# Patient Record
Sex: Female | Born: 1960 | Race: White | Hispanic: No | Marital: Married | Smoking: Never smoker
Health system: Southern US, Community
[De-identification: ages and names within clinical notes are randomized; demographics above are authoritative.]

## PROBLEM LIST (undated history)

## (undated) DIAGNOSIS — J3081 Allergic rhinitis due to animal (cat) (dog) hair and dander: Secondary | ICD-10-CM

## (undated) HISTORY — DX: Allergic rhinitis due to animal (cat) (dog) hair and dander: J30.81

## (undated) HISTORY — PX: TONSILLECTOMY: SUR1361

---

## 2006-04-24 ENCOUNTER — Ambulatory Visit: Payer: Self-pay | Admitting: Internal Medicine

## 2008-01-11 ENCOUNTER — Ambulatory Visit: Payer: Self-pay | Admitting: Occupational Medicine

## 2010-01-09 ENCOUNTER — Ambulatory Visit: Payer: Self-pay | Admitting: Cardiovascular Disease

## 2010-01-09 DIAGNOSIS — R079 Chest pain, unspecified: Secondary | ICD-10-CM | POA: Insufficient documentation

## 2010-01-10 ENCOUNTER — Telehealth: Payer: Self-pay | Admitting: Cardiovascular Disease

## 2010-01-25 ENCOUNTER — Ambulatory Visit: Payer: Self-pay | Admitting: Cardiovascular Disease

## 2010-04-19 ENCOUNTER — Ambulatory Visit: Payer: Self-pay | Admitting: Internal Medicine

## 2010-05-03 NOTE — Assessment & Plan Note (Signed)
Summary: NP6/AMD   Visit Type:  Initial Consult Primary Emina Ribaudo:  Holly Ramirez, M.D.  CC:  c/o chest pressure and palpitations.  Denies shortness of breath.  She had an irreg. stress test 3-4 years ago and just did not really follow up on it.Marland Kitchen  History of Present Illness: Holly Ramirez is a very pleasant 50 year old woman, patient of Dr. Judithann Sheen with a long history of left-sided chest pain with recent recurrence of her symptoms who presents for further evaluation.  She reports that she had chest pain several years ago on the left side. This pressure seemed to resolve without any intervention. She had a stress test in 2008 where she achieved a heart rate of 160 beats a minute. There was some apical hypokinesis. Ejection fraction 55% she did not followup and had no further episodes and had been doing well until several weeks ago when she had 2 episodes in September. They lasted for 30 minutes at a time. Now she has episodes one to 2 times per week and describes it as a dull aching in her left chest. 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  EKG shows normal sinus rhythm with rate 65 beats per minute, no significant ST or T wave changes    Preventive Screening-Counseling & Management  Alcohol-Tobacco     Smoking Status: never  Caffeine-Diet-Exercise     Does Patient Exercise: no      Drug Use:  no.    Current Medications (verified): 1)  None  Allergies (verified): No Known Drug Allergies  Past History:  Family History: Last updated: 2010-01-28 Father: Deceased age 82; prostate cancer Mother:Deceased age 66; MI CABG x 3 Siblings: 1 Brother MI age 80's; living                2 sisters living; good health  Maternal grandparents deceased age MI early 39's.  Social History: Last updated: 01-28-2010 Divorced  Full Time--Office reception school Tobacco Use - No.    Alcohol Use - yes--occas. wine Regular Exercise - no Drug Use - no  Risk Factors: Exercise: no (01-28-2010)  Risk Factors: Smoking Status: never (2010/01/28)  Past Medical History: Allergies  Past Surgical History: tonsillectomy C-section x 2  Family History: Father: Deceased age 10; prostate cancer Mother:Deceased age 26; MI CABG x 3 Siblings: 1 Brother MI age 37's; living                2 sisters living; good health  Maternal grandparents deceased age MI early 61's.  Social History: Divorced  Full Time--Office reception school Tobacco Use - No.  Alcohol Use - yes--occas. wine Regular Exercise - no Drug Use - no Smoking Status:  never Does Patient Exercise:  no Drug Use:  no  Review of Systems       The patient complains of chest pain.  The patient denies fever, weight loss, weight gain, vision loss, decreased hearing, hoarseness, syncope, dyspnea on exertion, peripheral edema, prolonged cough, abdominal pain, incontinence, muscle weakness, depression, and enlarged lymph nodes.    Vital Signs:  Patient profile:   50 year old female Height:      65 inches Weight:      169 pounds BMI:     28.22 Pulse rate:   73 / minute BP sitting:   130 / 87  (left arm) Cuff size:  regular  Vitals Entered By: Bishop Dublin, CMA (January 09, 2010 2:34 PM)  Physical Exam  General:  Well developed, well nourished, in no acute distress. Head:  normocephalic and atraumatic Neck:  Neck supple, no JVD. No masses, thyromegaly or abnormal cervical nodes. Lungs:  Clear bilaterally to auscultation and percussion. Heart:  Non-displaced PMI, chest non-tender; regular rate and rhythm, S1, S2 without murmurs, rubs or gallops. Carotid upstroke normal, no bruit.  Pedals normal pulses. No edema, no varicosities. Abdomen:  Bowel sounds positive; abdomen soft and non-tender without masses Msk:  Back normal, normal gait. Muscle strength and tone normal. Pulses:  pulses normal in all 4  extremities Extremities:  No clubbing or cyanosis. Neurologic:  Alert and oriented x 3. Skin:  Intact without lesions or rashes. Psych:  Normal affect.   Impression & Recommendations:  Problem # 1:  CHEST PAIN UNSPECIFIED (ICD-786.50) Her chest pain symptoms are somewhat atypical. It is very difficult given that she has no risk factors such as diabetes, smoking, known coronary artery disease or hypertension, for her to qualify for a Myoview.  We will set her up for a regular treadmill. She is in agreement with this. If her stress test is negative, the etiology of her discomfort is still uncertain. One could try anti-inflammatories/NSAIDs for possible pericarditis/costochondritis. She should try proton pump inhibitors for a several week period to GERD or gastritis. She has had significant stress in her life. Lastly, she could try nitrates or calcium channel blockers for possible spasm, including coronary or esophageal spasm.  Orders: Treadmill (Treadmill)  Patient Instructions: 1)  Your physician recommends that you continue on your current medications as directed. Please refer to the Current Medication list given to you today. 2)  Your physician has requested that you have an exercise tolerance test.  For further information please visit [email or web address]  Please also follow instruction sheet, as given.

## 2010-05-03 NOTE — Progress Notes (Signed)
Summary: ETT  Phone Note Outgoing Call   Call placed by: Benedict Needy, RN,  January 10, 2010 9:51 AM Call placed to: Patient Summary of Call: Called pt unable to schedule ETT on Friday. Does the pt want to do one day next week while Dr. Mariah Milling isn't here or wait.  Initial call taken by: Benedict Needy, RN,  January 10, 2010 9:52 AM  Follow-up for Phone Call        [place], [place] or [place] the week that  Dr. Mariah Milling is back Benedict Needy, RN  January 11, 2010 5:05 PM   ETT scheduled for 10/27 @ 8am pt to arrive at 7:30am. Unable to leave a message. Benedict Needy, RN  January 12, 2010 9:33 AM   LMOM TCB Benedict Needy, RN  January 15, 2010 3:10 PM   Pt called she is aware of appt.  Follow-up by: Benedict Needy, RN,  January 15, 2010 3:18 PM

## 2010-07-05 ENCOUNTER — Ambulatory Visit: Payer: Self-pay | Admitting: Podiatry

## 2010-10-23 ENCOUNTER — Encounter: Payer: Self-pay | Admitting: Cardiovascular Disease

## 2013-10-30 ENCOUNTER — Ambulatory Visit (INDEPENDENT_AMBULATORY_CARE_PROVIDER_SITE_OTHER): Payer: Self-pay | Admitting: Family Medicine

## 2013-10-30 VITALS — BP 120/80 | HR 69 | Temp 98.0°F | Resp 16 | Ht 66.0 in | Wt 172.0 lb

## 2013-10-30 DIAGNOSIS — Z23 Encounter for immunization: Secondary | ICD-10-CM

## 2013-10-30 DIAGNOSIS — IMO0001 Reserved for inherently not codable concepts without codable children: Secondary | ICD-10-CM

## 2013-10-30 DIAGNOSIS — W5501XA Bitten by cat, initial encounter: Secondary | ICD-10-CM

## 2013-10-30 DIAGNOSIS — T148XXA Other injury of unspecified body region, initial encounter: Secondary | ICD-10-CM

## 2013-10-30 MED ORDER — AMOXICILLIN-POT CLAVULANATE 875-125 MG PO TABS: 1.0000 | ORAL_TABLET | Freq: Two times a day (BID) | ORAL | Status: DC

## 2013-10-30 NOTE — Patient Instructions (Signed)

## 2013-10-30 NOTE — Progress Notes (Signed)
Chief Complaint:  Chief Complaint  Patient presents with  . Animal Bite    Cat bite at work 2 days ago  . Immunizations    Tetnus per her boss    HPI: Holly Ramirez is a 53 y.o. female who is here for  3 day history Left hand pain, swelling after a cat bite. 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 They were trying to draw the blood from its neck vein. The cat which is fully vaccinated got upset and bit the patient. She was not the one who actually was holding her neck  down, she was just helping. The person who actually was holding the cat down did not get bitten apparently. She had some extra left over abx and it was only amoxacillin 50 mg  and took it. She put herself on amoxacillin 7 doses of  500 mg . She cleaned the wounds throughly with surgical scrub and soap and water and chlorox bleach mixture. She thought it was improving since swelling and pain has gone down, but she is here today because the 2 puncture sites have been draining pus. She has no fevers or chills. NOt sure when last tetanus was done   Her PCP does not know last tetanus   Past Medical History  Diagnosis Date  . Cat allergies    Past Surgical History  Procedure Laterality Date  . Tonsillectomy    . Cesarean section      x2   History   Social History  . Marital Status: Single    Spouse Name: N/A    Number of Children: N/A  . Years of Education: N/A   Social History Main Topics  . Smoking status: Never Smoker   . Smokeless tobacco: None     Comment: tobacco use- no   . Alcohol Use: Yes     Comment: occas wine.   . Drug Use: No  . Sexual Activity: None   Other Topics Concern  . 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    Family History  Problem Relation Age of Onset  . Heart attack Mother   . Prostate cancer Father   . Heart  attack Brother   . Heart attack Maternal Grandmother   . Heart attack Maternal Grandfather    No Known Allergies Prior to Admission medications   Medication Sig Start Date End Date Taking? Authorizing Provider  amoxicillin (AMOXIL) 500 MG capsule Take 500 mg by mouth 3 (three) times daily.   Yes Historical Provider, MD     ROS: The patient denies fevers, chills, night sweats, unintentional weight loss, chest pain, palpitations, wheezing, dyspnea on exertion, nausea, vomiting, abdominal pain, dysuria, hematuria, melena, numbness, weakness, or tingling.   All other systems have been reviewed and were otherwise negative with the exception of those mentioned in the HPI and as above.    PHYSICAL EXAM: Filed Vitals:   10/30/13 1544  BP: 120/80  Pulse: 69  Temp: 98 F (36.7 C)  Resp: 16   Filed Vitals:   10/30/13 1544  Height: 5\' 6"  (1.676 m)  Weight: 172 lb (78.019 kg)   Body mass index is 27.77 kg/(m^2).  General: Alert, no acute distress HEENT:  Normocephalic, atraumatic, oropharynx patent. EOMI, PERRLA Cardiovascular:  Regular  rate and rhythm, no rubs murmurs or gallops.  No Carotid bruits, radial pulse intact. No pedal edema.  Respiratory: Clear to auscultation bilaterally.  No wheezes, rales, or rhonchi.  No cyanosis, no use of accessory musculature GI: No organomegaly, abdomen is soft and non-tender, positive bowel sounds.  No masses. Skin: + left hand puncture wound, minimal swelling of left hand, minimal tenderness, minimal erythema. Good cap refill, radial pulse, the skin is not tented. No purulent drainage, full ROm and strength and sensation  Neurologic: Facial musculature symmetric. Psychiatric: Patient is appropriate throughout our interaction. Lymphatic: No cervical lymphadenopathy Musculoskeletal: Gait intact.   LABS: No results found for this or any previous visit.   EKG/XRAY:   Primary [facility]   ASSESSMENT/PLAN: Encounter  Diagnoses  Name Primary?  . Cat bite, initial encounter Yes  . Need for TD vaccine    Rx Augmentin x 10 days Declined pain meds She got a Td today  Monitor for worsening sxs.  F/u prn  Gross sideeffects, risk and benefits, and alternatives of medications d/w patient. Patient is aware that all medications have potential sideeffects and we are unable to predict every sideeffect or drug-drug interaction that may occur.  Hamilton CapriLE, THAO PHUONG, DO 10/30/2013 4:04 PM

## 2015-10-20 ENCOUNTER — Telehealth: Payer: Self-pay | Admitting: *Deleted

## 2015-10-20 NOTE — Telephone Encounter (Signed)
Unable to reach patient at time of Pre-Visit Call.  Left message for patient to return call when available.    

## 2015-10-23 ENCOUNTER — Ambulatory Visit (INDEPENDENT_AMBULATORY_CARE_PROVIDER_SITE_OTHER): Payer: Managed Care, Other (non HMO) | Admitting: Family Medicine

## 2015-10-23 ENCOUNTER — Other Ambulatory Visit: Payer: Self-pay | Admitting: Family

## 2015-10-23 ENCOUNTER — Encounter: Payer: Self-pay | Admitting: Family Medicine

## 2015-10-23 VITALS — BP 140/80 | HR 65 | Temp 97.7°F | Ht 66.0 in | Wt 184.6 lb

## 2015-10-23 DIAGNOSIS — R0982 Postnasal drip: Secondary | ICD-10-CM

## 2015-10-23 DIAGNOSIS — J329 Chronic sinusitis, unspecified: Secondary | ICD-10-CM

## 2015-10-23 DIAGNOSIS — Z130 Encounter for screening for diseases of the blood and blood-forming organs and certain disorders involving the immune mechanism: Secondary | ICD-10-CM

## 2015-10-23 DIAGNOSIS — Z131 Encounter for screening for diabetes mellitus: Secondary | ICD-10-CM

## 2015-10-23 DIAGNOSIS — Z1211 Encounter for screening for malignant neoplasm of colon: Secondary | ICD-10-CM | POA: Diagnosis not present

## 2015-10-23 DIAGNOSIS — Z1329 Encounter for screening for other suspected endocrine disorder: Secondary | ICD-10-CM | POA: Diagnosis not present

## 2015-10-23 DIAGNOSIS — Z7189 Other specified counseling: Secondary | ICD-10-CM

## 2015-10-23 DIAGNOSIS — Z13220 Encounter for screening for lipoid disorders: Secondary | ICD-10-CM | POA: Diagnosis not present

## 2015-10-23 DIAGNOSIS — Z7689 Persons encountering health services in other specified circumstances: Secondary | ICD-10-CM

## 2015-10-23 DIAGNOSIS — Z119 Encounter for screening for infectious and parasitic diseases, unspecified: Secondary | ICD-10-CM

## 2015-10-23 DIAGNOSIS — Z1231 Encounter for screening mammogram for malignant neoplasm of breast: Secondary | ICD-10-CM

## 2015-10-23 DIAGNOSIS — J3089 Other allergic rhinitis: Secondary | ICD-10-CM

## 2015-10-23 LAB — CBC
HCT: 36.8 % (ref 36.0–46.0)
Hemoglobin: 12.3 g/dL (ref 12.0–15.0)
MCHC: 33.5 g/dL (ref 30.0–36.0)
MCV: 87.3 fl (ref 78.0–100.0)
PLATELETS: 340 10*3/uL (ref 150.0–400.0)
RBC: 4.22 Mil/uL (ref 3.87–5.11)
RDW: 14.1 % (ref 11.5–15.5)
WBC: 6.2 10*3/uL (ref 4.0–10.5)

## 2015-10-23 LAB — LIPID PANEL
CHOL/HDL RATIO: 3
Cholesterol: 154 mg/dL (ref 0–200)
HDL: 54 mg/dL (ref >39.00)
LDL Cholesterol: 78 mg/dL (ref 0–99)
NONHDL: 99.92
Triglycerides: 110 mg/dL (ref 0.0–149.0)
VLDL: 22 mg/dL (ref 0.0–40.0)

## 2015-10-23 LAB — COMPREHENSIVE METABOLIC PANEL
ALT: 15 U/L (ref 0–35)
AST: 18 U/L (ref 0–37)
Albumin: 4.5 g/dL (ref 3.5–5.2)
Alkaline Phosphatase: 63 U/L (ref 39–117)
BILIRUBIN TOTAL: 0.4 mg/dL (ref 0.2–1.2)
BUN: 13 mg/dL (ref 6–23)
CO2: 29 meq/L (ref 19–32)
CREATININE: 0.54 mg/dL (ref 0.40–1.20)
Calcium: 9.8 mg/dL (ref 8.4–10.5)
Chloride: 103 mEq/L (ref 96–112)
GFR: 124.77 mL/min (ref >60.00)
GLUCOSE: 89 mg/dL (ref 70–99)
Potassium: 4.1 mEq/L (ref 3.5–5.1)
SODIUM: 140 meq/L (ref 135–145)
Total Protein: 7.7 g/dL (ref 6.0–8.3)

## 2015-10-23 LAB — TSH: TSH: 1.25 u[IU]/mL (ref 0.35–4.50)

## 2015-10-23 LAB — HEMOGLOBIN A1C: Hgb A1c MFr Bld: 5.7 % (ref 4.6–6.5)

## 2015-10-23 LAB — HEPATITIS C ANTIBODY: HCV AB: NEGATIVE

## 2015-10-23 MED ORDER — IPRATROPIUM BROMIDE 0.03 % NA SOLN: 2.0000 | Freq: Four times a day (QID) | NASAL | 6 refills | Status: DC

## 2015-10-23 NOTE — Patient Instructions (Signed)
It was great to meet you today Try the atrovent nasal spray as needed for post nasal drainage- especially before bed I will be in touch with your labs, and the cologuard company will contact you and send you your kit Please stop by the imaging dept on the ground floor and schedule a mammogram We can plan to meet and do your pap at your conveneince

## 2015-10-23 NOTE — Progress Notes (Signed)
[facility] [address] [place], [place] [address] [phone]  Date:  10/23/2015   Name:  Holly Ramirez   DOB:  01/21/1961   MRN:  [id number]  PCP:  Abbe Amsterdam, MD    Chief Complaint: Establish Care (Pt here to est care. Has been busy taking care daughter daughter for the past four years and is behind on her care.)   History of Present Illness:  Holly Ramirez is a 55 y.o. very pleasant female patient who presents with the following:  Here today to establish care.  She is a new pt to [facility]- she had been in [place] but has moved closer to this office.  She has a special needs daughter who is 80 yo-  She has really been her focus over the last several years.  She does not have any chronic health issues as far as she knows, but she has fallen behind on her care She does tend towards chronic allergies and tends to get sinus infections, feels like she often has PND and a ST/ cough in the am She notes left ear pain, crackling, popping. She feels like her hearing is not as good as it could be.  The right ear seems to be ok Also she has noted intermittent pain and spasm in the right trapezius muscle. This is worse when she has been standing and working a lot. A steroid injection did not seem to help.  The left side is ok. She has noticed this issue for about 3 years, she cannot recall any particular injury.  She has fallen off a horse several times in her youth, and has fractured her right collarbone.    She did have a colonoscopy in the 1980s due to rectal bleeding- all was ok.  Is due for a recheck Her last mammo was approx 2012[facility] [place] Pap around the same time; never had an abnl pap She did have a minor mammogram abnl in the past but it turned out to be ok on follow-up testing, all ok since  Patient Active Problem List   Diagnosis Date Noted  . CHEST PAIN UNSPECIFIED 01/09/2010    No  past medical history on file.  Past Surgical History:  Procedure Laterality Date  . CESAREAN SECTION     x2  . TONSILLECTOMY      Social History  Substance Use Topics  . Smoking status: Never Smoker  . Smokeless tobacco: Not on file     Comment: tobacco use- no   . Alcohol use Yes     Comment: occas wine.     Family History  Problem Relation Age of Onset  . Heart attack Mother   . Prostate cancer Father   . Heart attack Brother   . Heart attack Maternal Grandmother   . Heart attack Maternal Grandfather     No Known Allergies  Medication list has been reviewed and updated.  Current Outpatient Prescriptions on File Prior to Visit  Medication Sig Dispense Refill  . amoxicillin-clavulanate (AUGMENTIN) 875-125 MG per tablet Take 1 tablet by mouth 2 (two) times daily. 20 tablet 0   No current facility-administered medications on file prior to visit.     Review of Systems:  As per HPI- otherwise negative.   Physical Examination: Vitals:   10/23/15 0846  BP: (!) 142/88  Pulse: 65  Temp: 97.7 F (36.5 C)    Ideal Body Weight:  GEN: WDWN, NAD, Non-toxic, A & O x 3, mild overweight, looks well HEENT: Atraumatic, Normocephalic. Neck supple. No masses, No LAD.  Bilateral TM wnl, oropharynx normal.  PEERL,EOMI.   Ears and Nose: No external deformity. CV: RRR, No M/G/R. No JVD. No thrill. No extra heart sounds. PULM: CTA B, no wheezes, crackles, rhonchi. No retractions. No resp. distress. No accessory muscle use. ABD: S, NT, ND. No rebound. No HSM. EXTR: No c/c/e NEURO Normal gait.  PSYCH: Normally interactive. Conversant. Not depressed or anxious appearing.  Calm demeanor.    Assessment and Plan: Post-nasal drainage - Plan: ipratropium (ATROVENT) 0.03 % nasal spray  Screening for deficiency anemia - Plan: CBC  Screening for colon cancer  Screening for thyroid disorder - Plan: TSH  Other allergic rhinitis  Encounter to establish care  Encounter for  screening for infectious and parasitic diseases, unspecified - Plan: Hepatitis C antibody  Screening for diabetes mellitus - Plan: Comprehensive metabolic panel, Hemoglobin A1c  Screening for hyperlipidemia - Plan: Lipid panel  Here today as a new patient to establish care and go over a few concerns She needs to catch up on her preventative health care Encouraged a mammo, did form for cologuard, labs today Will try atrovent nasal for her PND She will come back of a pap at her convenience   Signed Abbe Amsterdam, MD

## 2015-10-26 ENCOUNTER — Other Ambulatory Visit: Payer: Self-pay | Admitting: Family

## 2015-10-26 ENCOUNTER — Ambulatory Visit (HOSPITAL_BASED_OUTPATIENT_CLINIC_OR_DEPARTMENT_OTHER)
Admission: RE | Admit: 2015-10-26 | Discharge: 2015-10-26 | Disposition: A | Discharge status: Home / Self Care | Payer: Managed Care, Other (non HMO) | Source: Ambulatory Visit | Attending: Family | Admitting: Family

## 2015-10-26 DIAGNOSIS — Z1231 Encounter for screening mammogram for malignant neoplasm of breast: Secondary | ICD-10-CM | POA: Diagnosis not present

## 2015-10-26 DIAGNOSIS — R928 Other abnormal and inconclusive findings on diagnostic imaging of breast: Secondary | ICD-10-CM | POA: Diagnosis not present

## 2015-10-27 ENCOUNTER — Encounter: Payer: Self-pay | Admitting: Family Medicine

## 2015-10-31 ENCOUNTER — Encounter: Payer: Self-pay | Admitting: Family Medicine

## 2015-11-01 ENCOUNTER — Encounter: Payer: Self-pay | Admitting: Family Medicine

## 2015-11-01 ENCOUNTER — Other Ambulatory Visit: Payer: Self-pay | Admitting: Family

## 2015-11-01 DIAGNOSIS — R928 Other abnormal and inconclusive findings on diagnostic imaging of breast: Secondary | ICD-10-CM

## 2015-11-03 ENCOUNTER — Other Ambulatory Visit: Payer: Self-pay

## 2015-11-03 ENCOUNTER — Other Ambulatory Visit: Payer: Self-pay | Admitting: Family

## 2015-11-03 DIAGNOSIS — R928 Other abnormal and inconclusive findings on diagnostic imaging of breast: Secondary | ICD-10-CM

## 2015-11-06 ENCOUNTER — Ambulatory Visit
Admission: RE | Admit: 2015-11-06 | Discharge: 2015-11-06 | Disposition: A | Discharge status: Home / Self Care | Payer: Managed Care, Other (non HMO) | Source: Ambulatory Visit | Attending: Family | Admitting: Family

## 2015-11-06 ENCOUNTER — Other Ambulatory Visit: Payer: Self-pay | Admitting: Family Medicine

## 2015-11-06 DIAGNOSIS — R928 Other abnormal and inconclusive findings on diagnostic imaging of breast: Secondary | ICD-10-CM

## 2015-11-14 ENCOUNTER — Encounter: Payer: Self-pay | Admitting: Family Medicine

## 2015-11-24 LAB — COLOGUARD: Cologuard: NEGATIVE

## 2015-11-27 ENCOUNTER — Encounter: Payer: Self-pay | Admitting: Family Medicine

## 2016-05-27 ENCOUNTER — Ambulatory Visit (INDEPENDENT_AMBULATORY_CARE_PROVIDER_SITE_OTHER): Payer: Managed Care, Other (non HMO) | Admitting: Medical

## 2016-05-27 ENCOUNTER — Encounter: Payer: Self-pay | Admitting: Medical

## 2016-05-27 ENCOUNTER — Ambulatory Visit (HOSPITAL_BASED_OUTPATIENT_CLINIC_OR_DEPARTMENT_OTHER)
Admission: RE | Admit: 2016-05-27 | Discharge: 2016-05-27 | Disposition: A | Discharge status: Home / Self Care | Payer: Managed Care, Other (non HMO) | Source: Ambulatory Visit | Attending: Medical | Admitting: Medical

## 2016-05-27 VITALS — BP 128/98 | HR 72 | Temp 98.2°F | Resp 16 | Ht 66.0 in | Wt 184.0 lb

## 2016-05-27 DIAGNOSIS — M25572 Pain in left ankle and joints of left foot: Secondary | ICD-10-CM | POA: Insufficient documentation

## 2016-05-27 DIAGNOSIS — M799 Soft tissue disorder, unspecified: Secondary | ICD-10-CM | POA: Diagnosis not present

## 2016-05-27 NOTE — Patient Instructions (Addendum)
For your ankle pain will get xray of your left ankle.  Can continue rest, ice, compression and elevation. Use your boot/shoe. Can use ibuprofen otc.  If no fracture seen on xray can remove boot in one week. If pain still present at that point then let me know and may refer to sports medicine.  Follow up in one week or as needed

## 2016-05-27 NOTE — Progress Notes (Signed)
Pre visit review using our clinic review tool, if applicable. No additional management support is needed unless otherwise documented below in the visit note/SLS  

## 2016-05-27 NOTE — Progress Notes (Signed)
Subjective:    Patient ID: Holly Ramirez, female    DOB: 07-Mar-1961, 56 y.o.   MRN: [id number]  HPI  Pt in for some pain in her left ankle region. She fell in hole in parking lot of [employer] [place] saturday at lunch 2 days ago. Severe pain and swelling immediatly. Pt has done rice therapy. But found old boot from her prior left foot surgery. Pt had bunion surgery years ago.    Review of Systems  Constitutional: Negative for chills, fatigue and fever.  Respiratory: Negative for cough, choking, chest tightness, shortness of breath and wheezing.   Cardiovascular: Negative for chest pain and palpitations.  Gastrointestinal: Negative for abdominal pain.  Musculoskeletal:       Left ankle pain. Swelling.   Skin: Negative for rash.  Neurological: Negative for dizziness, syncope, speech difficulty, weakness, numbness and headaches.  Hematological: Negative for adenopathy. Does not bruise/bleed easily.  Psychiatric/Behavioral: Negative for behavioral problems and confusion.    No past medical history on file.   Social History   Social History  . Marital status: Married    Spouse name: N/A  . Number of children: N/A  . Years of education: N/A   Occupational History  . Not on file.   Social History Main Topics  . Smoking status: Never Smoker  . Smokeless tobacco: Never Used     Comment: tobacco use- no   . Alcohol use Yes     Comment: occas wine.   . Drug use: No  . Sexual activity: Not on file   Other Topics Concern  . 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     Past Surgical History:  Procedure Laterality Date  . CESAREAN SECTION     x2  . TONSILLECTOMY      Family History  Problem Relation Age of Onset  . Heart attack Mother   . Prostate cancer Father   . Heart attack Brother   . Heart attack Maternal Grandmother   . Heart attack Maternal Grandfather     No Known Allergies  Current  Outpatient Prescriptions on File Prior to Visit  Medication Sig Dispense Refill  . Multiple Vitamin (MULTIVITAMIN) tablet Take 1 tablet by mouth daily.     No current facility-administered medications on file prior to visit.     BP (!) 0/0 (BP Location: Right Arm, Patient Position: Sitting, Cuff Size: Large)   Pulse 72   Temp 98.2 F (36.8 C) (Oral)   Resp 16   Ht 5\' 6"  (1.676 m)   Wt 184 lb (83.5 kg)   SpO2 96%   BMI 29.70 kg/m       Objective:   Physical Exam  General- No acute distress. Pleasant patient. Lungs- Clear, even and unlabored. Heart- regular rate and rhythm. Neurologic- CNII- XII grossly intact.  Lt ankle- moderate swollen ankle lateral aspect. Tender over lateral malleolus. Diffuse swelling and  some bruising beneath fibula lateral to achilles tendon.  Lt foot- no pain on palpation through out the entire foot.       Assessment & Plan:  For your ankle pain will get xray of your left ankle.  Can continue rest, ice, compression and elevation. Use your boot/shoe. Can use ibuprofen otc.  If no fracture seen on xray can remove boot in one week. If pain still present at that point then let me know and may refer to sports medicine.  Can  use ibuprofen otc.  Follow up in one week or as need   Holly Ramirez, Ramon DredgeEdward, VF CorporationPA-C

## 2016-10-08 ENCOUNTER — Encounter: Payer: Self-pay | Admitting: Family Medicine

## 2017-01-25 NOTE — Progress Notes (Deleted)
[facility] at [facility] [address] [place], [place] [address] [phone]  Date:  01/27/2017   Name:  Holly Ramirez   DOB:  1960-04-21   MRN:  [id number]  PCP:  Pearline Cables, MD    Chief Complaint: No chief complaint on file.   History of Present Illness:  Holly Ramirez is a 56 y.o. very pleasant female patient who presents with the following:  Here for a CPE today Last sen by myself in July of 17:  Here today to establish care.  She is a new pt to [facility]- she had been in [place] but has moved closer to this office.  She has a special needs daughter who is 34 yo-  She has really been her focus over the last several years.  She does not have any chronic health issues as far as she knows, but she has fallen behind on her care She does tend towards chronic allergies and tends to get sinus infections, feels like she often has PND and a ST/ cough in the am She notes left ear pain, crackling, popping. She feels like her hearing is not as good as it could be.  The right ear seems to be ok Also she has noted intermittent pain and spasm in the right trapezius muscle. This is worse when she has been standing and working a lot. A steroid injection did not seem to help.  The left side is ok. She has noticed this issue for about 3 years, she cannot recall any particular injury.  She has fallen off a horse several times in her youth, and has fractured her right collarbone.    She did have a colonoscopy in the 1980s due to rectal bleeding- all was ok.  Is due for a recheck Her last mamm[facility] was approx 2012[facility] [place] Pap around the same time; never had an abnl pap She did have a minor mammogram abnl in the past but it turned out to be ok on follow-up testing, all ok since  Labs: Flu:due Pap: Mammo:7/17 Recent cologuard completed   Patient Active Problem List   Diagnosis Date Noted  . CHEST PAIN  UNSPECIFIED 01/09/2010    No past medical history on file.  Past Surgical History:  Procedure Laterality Date  . CESAREAN SECTION     x2  . TONSILLECTOMY      Social History  Substance Use Topics  . Smoking status: Never Smoker  . Smokeless tobacco: Never Used     Comment: tobacco use- no   . Alcohol use Yes     Comment: occas wine.     Family History  Problem Relation Age of Onset  . Heart attack Mother   . Prostate cancer Father   . Heart attack Brother   . Heart attack Maternal Grandmother   . Heart attack Maternal Grandfather     No Known Allergies  Medication list has been reviewed and updated.  Current Outpatient Prescriptions on File Prior to Visit  Medication Sig Dispense Refill  . Multiple Vitamin (MULTIVITAMIN) tablet Take 1 tablet by mouth daily.     No current facility-administered medications on file prior to visit.     Review of Systems:  As per HPI- otherwise negative.   Physical Examination: There were no vitals filed for this visit. There were no vitals filed for this visit. There is no height or weight on file to calculate BMI.  Ideal Body Weight:    GEN: WDWN, NAD, Non-toxic, A & O x 3 HEENT: Atraumatic, Normocephalic. Neck supple. No masses, No LAD. Ears and Nose: No external deformity. CV: RRR, No M/G/R. No JVD. No thrill. No extra heart sounds. PULM: CTA B, no wheezes, crackles, rhonchi. No retractions. No resp. distress. No accessory muscle use. ABD: S, NT, ND, +BS. No rebound. No HSM. EXTR: No c/c/e NEURO Normal gait.  PSYCH: Normally interactive. Conversant. Not depressed or anxious appearing.  Calm demeanor.    Assessment and Plan: ***  Signed Abbe AmsterdamJessica Reed Dady, MD

## 2017-01-27 ENCOUNTER — Encounter: Payer: Managed Care, Other (non HMO) | Admitting: Family Medicine

## 2017-01-27 DIAGNOSIS — Z0289 Encounter for other administrative examinations: Secondary | ICD-10-CM

## 2017-01-29 ENCOUNTER — Encounter: Payer: Self-pay | Admitting: Family Medicine

## 2017-01-29 ENCOUNTER — Ambulatory Visit (INDEPENDENT_AMBULATORY_CARE_PROVIDER_SITE_OTHER): Payer: Managed Care, Other (non HMO) | Admitting: Family Medicine

## 2017-01-29 VITALS — BP 140/90 | HR 68 | Temp 98.0°F | Ht 66.0 in | Wt 193.4 lb

## 2017-01-29 DIAGNOSIS — Z13220 Encounter for screening for lipoid disorders: Secondary | ICD-10-CM | POA: Diagnosis not present

## 2017-01-29 DIAGNOSIS — Z124 Encounter for screening for malignant neoplasm of cervix: Secondary | ICD-10-CM | POA: Diagnosis not present

## 2017-01-29 DIAGNOSIS — R635 Abnormal weight gain: Secondary | ICD-10-CM

## 2017-01-29 DIAGNOSIS — Z131 Encounter for screening for diabetes mellitus: Secondary | ICD-10-CM

## 2017-01-29 DIAGNOSIS — B009 Herpesviral infection, unspecified: Secondary | ICD-10-CM

## 2017-01-29 DIAGNOSIS — Z1231 Encounter for screening mammogram for malignant neoplasm of breast: Secondary | ICD-10-CM

## 2017-01-29 DIAGNOSIS — Z130 Encounter for screening for diseases of the blood and blood-forming organs and certain disorders involving the immune mechanism: Secondary | ICD-10-CM

## 2017-01-29 DIAGNOSIS — Z0000 Encounter for general adult medical examination without abnormal findings: Secondary | ICD-10-CM | POA: Diagnosis not present

## 2017-01-29 DIAGNOSIS — F439 Reaction to severe stress, unspecified: Secondary | ICD-10-CM

## 2017-01-29 DIAGNOSIS — Z1239 Encounter for other screening for malignant neoplasm of breast: Secondary | ICD-10-CM

## 2017-01-29 LAB — CBC
HCT: 38.5 % (ref 36.0–46.0)
Hemoglobin: 12.7 g/dL (ref 12.0–15.0)
MCHC: 32.9 g/dL (ref 30.0–36.0)
MCV: 89.9 fl (ref 78.0–100.0)
Platelets: 337 10*3/uL (ref 150.0–400.0)
RBC: 4.28 Mil/uL (ref 3.87–5.11)
RDW: 14.3 % (ref 11.5–15.5)
WBC: 6 10*3/uL (ref 4.0–10.5)

## 2017-01-29 LAB — LIPID PANEL
CHOL/HDL RATIO: 3
Cholesterol: 160 mg/dL (ref 0–200)
HDL: 62.2 mg/dL (ref >39.00)
LDL Cholesterol: 86 mg/dL (ref 0–99)
NONHDL: 98.28
Triglycerides: 63 mg/dL (ref 0.0–149.0)
VLDL: 12.6 mg/dL (ref 0.0–40.0)

## 2017-01-29 LAB — COMPREHENSIVE METABOLIC PANEL
ALBUMIN: 4.4 g/dL (ref 3.5–5.2)
ALK PHOS: 62 U/L (ref 39–117)
ALT: 15 U/L (ref 0–35)
AST: 18 U/L (ref 0–37)
BUN: 12 mg/dL (ref 6–23)
CO2: 31 mEq/L (ref 19–32)
CREATININE: 0.54 mg/dL (ref 0.40–1.20)
Calcium: 9.5 mg/dL (ref 8.4–10.5)
Chloride: 102 mEq/L (ref 96–112)
GFR: 124.19 mL/min (ref >60.00)
GLUCOSE: 84 mg/dL (ref 70–99)
Potassium: 3.8 mEq/L (ref 3.5–5.1)
SODIUM: 140 meq/L (ref 135–145)
TOTAL PROTEIN: 7.8 g/dL (ref 6.0–8.3)
Total Bilirubin: 0.5 mg/dL (ref 0.2–1.2)

## 2017-01-29 LAB — HEMOGLOBIN A1C: HEMOGLOBIN A1C: 5.7 % (ref 4.6–6.5)

## 2017-01-29 LAB — TSH: TSH: 1.18 u[IU]/mL (ref 0.35–4.50)

## 2017-01-29 MED ORDER — VALACYCLOVIR HCL 1 G PO TABS: ORAL_TABLET | ORAL | 0 refills | Status: DC

## 2017-01-29 NOTE — Patient Instructions (Addendum)
It was nice to see you today- take care and I will be in touch with your labs asap Please do check your BP a few times at home. If you continue to run higher than 140/90 at home, we can start you on a low dose of blood pressure medication if need be  We will have you use valtrex as needed for your HSV  Please come back and see me in a couple of weeks for your pap and flu shot We will check your TSH to see if this may be why you have gained some weight.  Menopause is also a notorious cause of weight gain for many women due to metabolic and hormonal changes.  Some of this is beyond your control, but you can still stay healthy with exercise and eating right    Health Maintenance for Postmenopausal Women Menopause is a normal process in which your reproductive ability comes to an end. This process happens gradually over a span of months to years, usually between the ages of 37 and 23. Menopause is complete when you have missed 12 consecutive menstrual periods. It is important to talk with your health care provider about some of the most common conditions that affect postmenopausal women, such as heart disease, cancer, and bone loss (osteoporosis). Adopting a healthy lifestyle and getting preventive care can help to promote your health and wellness. Those actions can also lower your chances of developing some of these common conditions. What should I know about menopause? During menopause, you may experience a number of symptoms, such as:  Moderate-to-severe hot flashes.  Night sweats.  Decrease in sex drive.  Mood swings.  Headaches.  Tiredness.  Irritability.  Memory problems.  Insomnia.  Choosing to treat or not to treat menopausal changes is an individual decision that you make with your health care provider. What should I know about hormone replacement therapy and supplements? Hormone therapy products are effective for treating symptoms that are associated with menopause, such as  hot flashes and night sweats. Hormone replacement carries certain risks, especially as you become older. If you are thinking about using estrogen or estrogen with progestin treatments, discuss the benefits and risks with your health care provider. What should I know about heart disease and stroke? Heart disease, heart attack, and stroke become more likely as you age. This may be due, in part, to the hormonal changes that your body experiences during menopause. These can affect how your body processes dietary fats, triglycerides, and cholesterol. Heart attack and stroke are both medical emergencies. There are many things that you can do to help prevent heart disease and stroke:  Have your blood pressure checked at least every 1-2 years. High blood pressure causes heart disease and increases the risk of stroke.  If you are 50-34 years old, ask your health care provider if you should take aspirin to prevent a heart attack or a stroke.  Do not use any tobacco products, including cigarettes, chewing tobacco, or electronic cigarettes. If you need help quitting, ask your health care provider.  It is important to eat a healthy diet and maintain a healthy weight. ? Be sure to include plenty of vegetables, fruits, low-fat dairy products, and lean protein. ? Avoid eating foods that are high in solid fats, added sugars, or salt (sodium).  Get regular exercise. This is one of the most important things that you can do for your health. ? Try to exercise for at least 150 minutes each week. The type of  exercise that you do should increase your heart rate and make you sweat. This is known as moderate-intensity exercise. ? Try to do strengthening exercises at least twice each week. Do these in addition to the moderate-intensity exercise.  Know your numbers.Ask your health care provider to check your cholesterol and your blood glucose. Continue to have your blood tested as directed by your health care  provider.  What should I know about cancer screening? There are several types of cancer. Take the following steps to reduce your risk and to catch any cancer development as early as possible. Breast Cancer  Practice breast self-awareness. ? This means understanding how your breasts normally appear and feel. ? It also means doing regular breast self-exams. Let your health care provider know about any changes, no matter how small.  If you are 57 or older, have a clinician do a breast exam (clinical breast exam or CBE) every year. Depending on your age, family history, and medical history, it may be recommended that you also have a yearly breast X-ray (mammogram).  If you have a family history of breast cancer, talk with your health care provider about genetic screening.  If you are at high risk for breast cancer, talk with your health care provider about having an MRI and a mammogram every year.  Breast cancer (BRCA) gene test is recommended for women who have family members with BRCA-related cancers. Results of the assessment will determine the need for genetic counseling and BRCA1 and for BRCA2 testing. BRCA-related cancers include these types: ? Breast. This occurs in males or females. ? Ovarian. ? Tubal. This may also be called fallopian tube cancer. ? Cancer of the abdominal or pelvic lining (peritoneal cancer). ? Prostate. ? Pancreatic.  Cervical, Uterine, and Ovarian Cancer Your health care provider may recommend that you be screened regularly for cancer of the pelvic organs. These include your ovaries, uterus, and vagina. This screening involves a pelvic exam, which includes checking for microscopic changes to the surface of your cervix (Pap test).  For women ages 21-65, health care providers may recommend a pelvic exam and a Pap test every three years. For women ages 74-65, they may recommend the Pap test and pelvic exam, combined with testing for human papilloma virus (HPV), every  five years. Some types of HPV increase your risk of cervical cancer. Testing for HPV may also be done on women of any age who have unclear Pap test results.  Other health care providers may not recommend any screening for nonpregnant women who are considered low risk for pelvic cancer and have no symptoms. Ask your health care provider if a screening pelvic exam is right for you.  If you have had past treatment for cervical cancer or a condition that could lead to cancer, you need Pap tests and screening for cancer for at least 20 years after your treatment. If Pap tests have been discontinued for you, your risk factors (such as having a new sexual partner) need to be reassessed to determine if you should start having screenings again. Some women have medical problems that increase the chance of getting cervical cancer. In these cases, your health care provider may recommend that you have screening and Pap tests more often.  If you have a family history of uterine cancer or ovarian cancer, talk with your health care provider about genetic screening.  If you have vaginal bleeding after reaching menopause, tell your health care provider.  There are currently no reliable tests available  to screen for ovarian cancer.  Lung Cancer Lung cancer screening is recommended for adults 61-62 years old who are at high risk for lung cancer because of a history of smoking. A yearly low-dose CT scan of the lungs is recommended if you:  Currently smoke.  Have a history of at least 30 pack-years of smoking and you currently smoke or have quit within the past 15 years. A pack-year is smoking an average of one pack of cigarettes per day for one year.  Yearly screening should:  Continue until it has been 15 years since you quit.  Stop if you develop a health problem that would prevent you from having lung cancer treatment.  Colorectal Cancer  This type of cancer can be detected and can often be  prevented.  Routine colorectal cancer screening usually begins at age 60 and continues through age 5.  If you have risk factors for colon cancer, your health care provider may recommend that you be screened at an earlier age.  If you have a family history of colorectal cancer, talk with your health care provider about genetic screening.  Your health care provider may also recommend using home test kits to check for hidden blood in your stool.  A small camera at the end of a tube can be used to examine your colon directly (sigmoidoscopy or colonoscopy). This is done to check for the earliest forms of colorectal cancer.  Direct examination of the colon should be repeated every 5-10 years until age 26. However, if early forms of precancerous polyps or small growths are found or if you have a family history or genetic risk for colorectal cancer, you may need to be screened more often.  Skin Cancer  Check your skin from head to toe regularly.  Monitor any moles. Be sure to tell your health care provider: ? About any new moles or changes in moles, especially if there is a change in a mole's shape or color. ? If you have a mole that is larger than the size of a pencil eraser.  If any of your family members has a history of skin cancer, especially at a young age, talk with your health care provider about genetic screening.  Always use sunscreen. Apply sunscreen liberally and repeatedly throughout the day.  Whenever you are outside, protect yourself by wearing long sleeves, pants, a wide-brimmed hat, and sunglasses.  What should I know about osteoporosis? Osteoporosis is a condition in which bone destruction happens more quickly than new bone creation. After menopause, you may be at an increased risk for osteoporosis. To help prevent osteoporosis or the bone fractures that can happen because of osteoporosis, the following is recommended:  If you are 45-63 years old, get at least 1,000 mg of  calcium and at least 600 mg of vitamin D per day.  If you are older than age 33 but younger than age 73, get at least 1,200 mg of calcium and at least 600 mg of vitamin D per day.  If you are older than age 75, get at least 1,200 mg of calcium and at least 800 mg of vitamin D per day.  Smoking and excessive alcohol intake increase the risk of osteoporosis. Eat foods that are rich in calcium and vitamin D, and do weight-bearing exercises several times each week as directed by your health care provider. What should I know about how menopause affects my mental health? Depression may occur at any age, but it is more common as  you become older. Common symptoms of depression include:  Low or sad mood.  Changes in sleep patterns.  Changes in appetite or eating patterns.  Feeling an overall lack of motivation or enjoyment of activities that you previously enjoyed.  Frequent crying spells.  Talk with your health care provider if you think that you are experiencing depression. What should I know about immunizations? It is important that you get and maintain your immunizations. These include:  Tetanus, diphtheria, and pertussis (Tdap) booster vaccine.  Influenza every year before the flu season begins.  Pneumonia vaccine.  Shingles vaccine.  Your health care provider may also recommend other immunizations. This information is not intended to replace advice given to you by your health care provider. Make sure you discuss any questions you have with your health care provider. Document Released: 05/10/2005 Document Revised: 10/06/2015 Document Reviewed: 12/20/2014 Elsevier Interactive Patient Education  2018 [employer].

## 2017-01-29 NOTE — Progress Notes (Addendum)
[facility] [address], [address] [place], [place] [address] [phone] [phone]  Date:  01/29/2017   Name:  Holly MarylandMelissa V Qualls   DOB:  10-Nov-1960   MRN:  [id number]  PCP:  Pearline Cablesopland, Marcea Rojek C, MD    Chief Complaint: Annual Exam (Pt here for CPE. )   History of Present Illness:  Holly Ramirez is a 56 y.o. very pleasant female patient who presents with the following:  Here today for a CPE I last saw her a little over a year ago:  Here today to establish care.  She is a new pt to [facility]- [place] been in [place] but has moved closer to this office.  She has a special needs daughter who is 56 yo-  She has really been her focus over the last several years.  She does not have any chronic health issues as far as she knows, but she has fallen behind on her care She does tend towards chronic allergies and tends to get sinus infections, feels like she often has PND and a ST/ cough in the am She notes left ear pain, crackling, popping. She feels like her hearing is not as good as it could be.  The right ear seems to be ok Also she has noted intermittent pain and spasm in the right trapezius muscle. This is worse when she has been standing and working a lot. A steroid injection did not seem to help.  The left side is ok. She has noticed this issue for about 3 years, she cannot recall any particular injury.  She has fallen off a horse several times in her youth, and has fractured her right collarbone.    She did have a colonoscopy in the 1980s due to rectal bleeding- all was ok.  Is due for a recheck Her [facility]x [facility] [place] [facility] around the same time; never had an abnl pap She did have a minor mammogram abnl in the past but it turned out to be ok on follow-up testing, all ok since  Due for Flu: will do another day due to current illness Mammo: pt thinks she is UTD Pap: does not want to do today.  It has  been a few years since she had this done and she is bit nervous, but would like to do another day soon She never had an abnl pap Did cologuard last year which was negative  She has noted a genital herpes outbreak which started 3 days ago- she has not had this in years.  She last had this "years and years" ago  We will give her some valtrex today to use for HSV  She is fasting today  Her oldest daughter TurkeyVictoria is doing well in college, her youngest Jaclynn Guarneriisabella is 56 yo and has special needs. She suffers from severe depression and anxiety.    Clarine notes that she has been though a lot with her daughter's healthcare and wonders if this is why her BP tends to come up in MD office She will check her BP at home and alert me if consistently high  BP Readings from Last 3 Encounters:  01/29/17 140/90  05/27/16 (!) 128/98  10/23/15 140/80   She notes that her weight is difficult to control- her base weight was abut 140 lbs when she was younger, but lately she seems to keep on going up.  She tries to eat well and  exercise when she can Admits to being undre a lot of stress   Patient Active Problem List   Diagnosis Date Noted  . CHEST PAIN UNSPECIFIED 01/09/2010    No past medical history on file.  Past Surgical History:  Procedure Laterality Date  . CESAREAN SECTION     x2  . TONSILLECTOMY      Social History  Substance Use Topics  . Smoking status: Never Smoker  . Smokeless tobacco: Never Used     Comment: tobacco use- no   . Alcohol use Yes     Comment: occas wine.     Family History  Problem Relation Age of Onset  . Heart attack Mother   . Prostate cancer Father   . Heart attack Brother   . Heart attack Maternal Grandmother   . Heart attack Maternal Grandfather     No Known Allergies  Medication list has been reviewed and updated.  Current Outpatient Prescriptions on File Prior to Visit  Medication Sig Dispense Refill  . Multiple Vitamin (MULTIVITAMIN) tablet Take 1  tablet by mouth daily.     No current facility-administered medications on file prior to visit.     Review of Systems:  As per HPI- otherwise negative. No fever or chills No CP or SOB  Physical Examination: Vitals:   01/29/17 1156  BP: 140/90  Pulse: 68  Temp: 98 F (36.7 C)  SpO2: 97%   Vitals:   01/29/17 1156  Weight: 193 lb 6.4 oz (87.7 kg)  Height: 5\' 6"  (1.676 m)   Body mass index is 31.22 kg/m. Ideal Body Weight: Weight in (lb) to have BMI = 25: 154.6  GEN: WDWN, NAD, Non-toxic, A & O x 3 HEENT: Atraumatic, Normocephalic. Neck supple. No masses, No LAD. Ears and Nose: No external deformity. CV: RRR, No M/G/R. No JVD. No thrill. No extra heart sounds. PULM: CTA B, no wheezes, crackles, rhonchi. No retractions. No resp. distress. No accessory muscle use. ABD: S, NT, ND, +BS. No rebound. No HSM. EXTR: No c/c/e NEURO Normal gait.  PSYCH: Normally interactive. Conversant. Not depressed or anxious appearing.  Calm demeanor.    Assessment and Plan: Physical exam  Screening for deficiency anemia - Plan: CBC  Screening for diabetes mellitus - Plan: Comprehensive metabolic panel, Hemoglobin A1c  Screening for hyperlipidemia - Plan: Lipid panel  Screening for cervical cancer  Screening for breast cancer  Weight gain - Plan: TSH  HSV-2 (herpes simplex virus 2) infection - Plan: valACYclovir (VALTREX) 1000 MG tablet  CPE today Labs pending She will return for her pap and flu in a couple of weeks Discussed her weight- will check thyroid as hypothyroidism runs in her family Encourage weight training to build and maintain muscle mass and boost metabolism See patient instructions for more details.     Signed Abbe Amsterdam, MD  Received her labs- message to pt  Results for orders placed or performed in visit on 01/29/17  CBC  Result Value Ref Range   WBC 6.0 4.0 - 10.5 K/uL   RBC 4.28 3.87 - 5.11 Mil/uL   Platelets 337.0 150.0 - 400.0 K/uL    Hemoglobin 12.7 12.0 - 15.0 g/dL   HCT 16.1 09.6 - 04.5 %   MCV 89.9 78.0 - 100.0 fl   MCHC 32.9 30.0 - 36.0 g/dL   RDW 40.9 81.1 - 91.4 %  Comprehensive metabolic panel  Result Value Ref Range   Sodium 140 135 - 145 mEq/L   Potassium 3.8 3.5 -  5.1 mEq/L   Chloride 102 96 - 112 mEq/L   CO2 31 19 - 32 mEq/L   Glucose, Bld 84 70 - 99 mg/dL   BUN 12 6 - 23 mg/dL   Creatinine, Ser 9.81 0.40 - 1.20 mg/dL   Total Bilirubin 0.5 0.2 - 1.2 mg/dL   Alkaline Phosphatase 62 39 - 117 U/L   AST 18 0 - 37 U/L   ALT 15 0 - 35 U/L   Total Protein 7.8 6.0 - 8.3 g/dL   Albumin 4.4 3.5 - 5.2 g/dL   Calcium 9.5 8.4 - 19.1 mg/dL   GFR 478.29 >56.21 mL/min  Hemoglobin A1c  Result Value Ref Range   Hgb A1c MFr Bld 5.7 4.6 - 6.5 %  Lipid panel  Result Value Ref Range   Cholesterol 160 0 - 200 mg/dL   Triglycerides 30.8 0.0 - 149.0 mg/dL   HDL 65.78 >46.96 mg/dL   VLDL 29.5 0.0 - 28.4 mg/dL   LDL Cholesterol 86 0 - 99 mg/dL   Total CHOL/HDL Ratio 3    NonHDL 98.28   TSH  Result Value Ref Range   TSH 1.18 0.35 - 4.50 uIU/mL

## 2017-01-30 ENCOUNTER — Encounter: Payer: Self-pay | Admitting: Family Medicine

## 2017-01-31 ENCOUNTER — Encounter: Payer: Self-pay | Admitting: Family Medicine

## 2017-02-01 ENCOUNTER — Encounter: Payer: Self-pay | Admitting: Family Medicine

## 2017-02-11 NOTE — Progress Notes (Addendum)
[facility] [address], [address] [place], [place] [address] [phone] [phone]  Date:  02/12/2017   Name:  Holly Ramirez   DOB:  [birth year]   MRN:  [id number]  PCP:  Pearline Cablesopland, Aramis Zobel C, MD    Chief Complaint: Gynecologic Exam   History of Present Illness:  Holly Ramirez is a 56 y.o. very pleasant female patient who presents with the following:  Here today for a screening pap and flu shot Last seen by myself just recently for her CPE- however we did not do her pap as she wished to wait a bit She is not aware of any abnl pap history She is post- menopausal, no bleeidng   Pap: does not want to do today.  It has been a few years since she had this done and she is bit nervous, but would like to do another day soon She never had an abnl pap Did cologuard last year which was negative  She has noted a genital herpes outbreak which started 3 days ago- she has not had this in years.  She last had this "years and years" ago  We will give her some valtrex today to use for HSV  She also notes that her first right MPC has become more stiff and painful over the last several months. She will sometimes trip because the toe does not extend well due to stiffness.  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  She did have some sort of left foot operation in the past and does not want to do any surgery on the right  She has been applying a topical diclofenac which does seem to be helping her some   Patient Active Problem List   Diagnosis Date Noted  . Situational stress 01/29/2017  . 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 CHEST PAIN UNSPECIFIED 01/09/2010    No past medical history on file.  Past Surgical History:  Procedure Laterality Date  . CESAREAN SECTION     x2  . TONSILLECTOMY      Social History   Tobacco Use  . Smoking status: Never Smoker  . Smokeless tobacco: Never  Used  . Tobacco comment: tobacco use- no   Substance Use Topics  . Alcohol use: Yes    Comment: occas wine.   . Drug use: No    Family History  Problem Relation Age of Onset  . Heart attack Mother   . Prostate cancer Father   . Heart attack Brother   . Heart attack Maternal Grandmother   . Heart attack Maternal Grandfather     No Known Allergies  Medication list has been reviewed and updated.  Current Outpatient Medications on File Prior to Visit  Medication Sig Dispense Refill  . Multiple Vitamin (MULTIVITAMIN) tablet Take 1 tablet by mouth daily.    . valACYclovir (VALTREX) 1000 MG tablet Take one pill daily for 5 days.  Use as needed for herpes outbreak 20 tablet 0   No current facility-administered medications on file prior to visit.     Review of Systems:  As per HPI- otherwise negative. No fever or chills No CP or SOB No rash, ST, or cough   Physical Examination: Vitals:   02/12/17 1258  BP: 134/84  Pulse: 89  Temp: 98.3 F (36.8 C)  SpO2: 97%   Vitals:   02/12/17 1258  Weight: 194 lb (88 kg)  Height: 5\' 6"  (1.676 m)   Body mass index is 31.31 kg/m. Ideal Body Weight: Weight in (lb) to have BMI = 25: 154.6  GEN: WDWN, NAD, Non-toxic, A & O x 3, overweight looks well HEENT: Atraumatic, Normocephalic. Neck supple. No masses, No LAD. Ears and Nose: No external deformity. CV: RRR, No M/G/R. No JVD. No thrill. No extra heart sounds. PULM: CTA B, no wheezes, crackles, rhonchi. No retractions. No resp. distress. No accessory muscle use. ABD: S, NT, ND, +BS. No rebound. No HSM. EXTR: No c/c/e NEURO Normal gait.  PSYCH: Normally interactive. Conversant. Not depressed or anxious appearing.  Calm demeanor.  Right 1st MCP shows normal flexion but restricted extension and thickening of joint, suspect due to OA Pelvic: normal, no vaginal lesions or discharge. Uterus normal, no CMT, no adnexal tendereness or masses   Assessment and Plan: Screening for  cervical cancer - Plan: Cytology - PAP  Immunization due - Plan: Flu Vaccine QUAD 6+ mos PF IM (Fluarix Quad PF)  Right foot pain - Plan: DG Foot Complete Right  Pap today- was overdue Flu shot given Old injuries to first right MTP- will obtain plain films for her today and let her know how they look,  She is not interested in surgery but might want to have a IA steroid injection    Signed Abbe AmsterdamJessica Samadhi Mahurin, MD  Received her films- message to pt  Dg Foot Complete Right  Result Date: 02/12/2017 CLINICAL DATA:  Two years of intermittent right foot pain and swelling at the base of the proximal phalanx of the great toe. EXAM: RIGHT FOOT COMPLETE - 3+ VIEW COMPARISON:  None in PACs FINDINGS: The bones of the right foot are subjectively adequately mineralized. There is moderate degenerative change of the first metatarsophalangeal joint with asymmetric joint space narrowing and small marginal osteophytes. The IP joint of the great toe is unremarkable. The MTP joints and IP joints of the other toes are normal. The tarsometatarsal and intertarsal joint spaces are well maintained. There is a plantar calcaneal spur. 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 There is no acute fracture nor other acute bony abnormality. Electronically Signed   By: David  [place] M.D.   On: 02/12/2017 15:51   Received her pap 11/23 Results for orders placed or performed in visit on 02/12/17  Cytology - PAP  Result Value Ref Range   Adequacy      Satisfactory for evaluation  endocervical/transformation zone component PRESENT.   Diagnosis      NEGATIVE FOR INTRAEPITHELIAL LESIONS OR MALIGNANCY.   HPV NOT DETECTED    Material Submitted CervicoVaginal Pap [ThinPrep Imaged]    CYTOLOGY - PAP PAP RESULT    Message to pt

## 2017-02-12 ENCOUNTER — Ambulatory Visit (HOSPITAL_BASED_OUTPATIENT_CLINIC_OR_DEPARTMENT_OTHER)
Admission: RE | Admit: 2017-02-12 | Discharge: 2017-02-12 | Disposition: A | Discharge status: Home / Self Care | Payer: Managed Care, Other (non HMO) | Source: Ambulatory Visit | Attending: Family Medicine | Admitting: Family Medicine

## 2017-02-12 ENCOUNTER — Ambulatory Visit: Payer: Managed Care, Other (non HMO) | Admitting: Family Medicine

## 2017-02-12 ENCOUNTER — Other Ambulatory Visit (HOSPITAL_COMMUNITY)
Admission: RE | Admit: 2017-02-12 | Discharge: 2017-02-12 | Disposition: A | Discharge status: Home / Self Care | Payer: Managed Care, Other (non HMO) | Source: Ambulatory Visit | Attending: Family Medicine | Admitting: Family Medicine

## 2017-02-12 ENCOUNTER — Encounter: Payer: Self-pay | Admitting: Family Medicine

## 2017-02-12 VITALS — BP 134/84 | HR 89 | Temp 98.3°F | Ht 66.0 in | Wt 194.0 lb

## 2017-02-12 DIAGNOSIS — Z23 Encounter for immunization: Secondary | ICD-10-CM | POA: Diagnosis not present

## 2017-02-12 DIAGNOSIS — M79671 Pain in right foot: Secondary | ICD-10-CM

## 2017-02-12 DIAGNOSIS — M19071 Primary osteoarthritis, right ankle and foot: Secondary | ICD-10-CM | POA: Diagnosis not present

## 2017-02-12 DIAGNOSIS — Z124 Encounter for screening for malignant neoplasm of cervix: Secondary | ICD-10-CM

## 2017-02-12 NOTE — Patient Instructions (Signed)
I will be touch with your pap asap Please stop by the x-ray dept on the ground floor to have pictures of your foot- I will let you know what they show

## 2017-02-19 LAB — CYTOLOGY - PAP
DIAGNOSIS: NEGATIVE
HPV: NOT DETECTED

## 2017-02-21 ENCOUNTER — Encounter: Payer: Self-pay | Admitting: Family Medicine

## 2017-08-18 ENCOUNTER — Encounter: Payer: Self-pay | Admitting: Family Medicine

## 2017-08-18 ENCOUNTER — Telehealth: Payer: Self-pay

## 2017-08-18 ENCOUNTER — Ambulatory Visit: Payer: Managed Care, Other (non HMO) | Admitting: Family Medicine

## 2017-08-18 VITALS — BP 130/70 | HR 70 | Temp 98.5°F | Resp 16 | Ht 66.0 in | Wt 191.0 lb

## 2017-08-18 DIAGNOSIS — J321 Chronic frontal sinusitis: Secondary | ICD-10-CM | POA: Diagnosis not present

## 2017-08-18 DIAGNOSIS — L989 Disorder of the skin and subcutaneous tissue, unspecified: Secondary | ICD-10-CM | POA: Diagnosis not present

## 2017-08-18 MED ORDER — AMOXICILLIN 500 MG PO CAPS: 1000.0000 mg | ORAL_CAPSULE | Freq: Two times a day (BID) | ORAL | 0 refills | Status: DC

## 2017-08-18 NOTE — Patient Instructions (Addendum)
It was good to see you today- I will treat for you a sinus infection with amoxicillin for 10 days You can continue your nasal treatments and allergy meds as needed However, if your symptoms do not resolve prednisone may be helpful- let me now if you need this.  Also, I will have you see dermatology to look at the spot under your left eye  Let me know if your symptoms do not resolve in a week or so- Sooner if worse.

## 2017-08-18 NOTE — Progress Notes (Signed)
[facility] [address], [address] [place], [place] [address] [phone]  Date:  08/18/2017   Name:  Holly Ramirez   DOB:  November 28, 1960   MRN:  [id number]  PCP:  Pearline Cables, MD    Chief Complaint: Sinusitis (ear pain, left ear, headache, sinus pressure, swollen lymph nodes, sneezing, congestion, slight fever)   History of Present Illness:  Holly Ramirez is a 57 y.o. very pleasant female patient who presents with the following:  Last seen by myself for a routine visit/ pap back in November of 2018 Generally healthy pt here today with concern of acute illness- she is here today with sx of her LEFT ear "cracking and popping all the time like you're on an airplane." it has bothered her for a year or so,but worse over the last 3 weeks  It is starting to hurt her, and she notes a left sided headache and perhaps some left sided cervical nodes  She suspects a sinus infection No cough  She notes that she is "a chronic allergy sufferer"  She notes that "when the barometric pressure changes I'm almost scared."  She is taking some sudafed OTC which does help, but she does not like the SE such as racing heart   She notes that she has had chills and that her "normal temp is 97" so she has a "low grade fever" today She has noted some skin changes under her left eye - it looks like she has a dilated blood vessel there which is starting to bother her/  She would like to see dermatology about this if she could  She also notes that she injured her nose in a horse accident years ago, and was told that she had broken her nose. She wonders if this contributes to her frequent nasal sx. Offered to have her see ENT but she declines for now   Patient Active Problem List   Diagnosis Date Noted  . Situational stress 01/29/2017  . 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 CHEST PAIN UNSPECIFIED 01/09/2010    History  reviewed. No pertinent past medical history.  Past Surgical History:  Procedure Laterality Date  . CESAREAN SECTION     x2  . TONSILLECTOMY      Social History   Tobacco Use  . Smoking status: Never Smoker  . Smokeless tobacco: Never Used  . Tobacco comment: tobacco use- no   Substance Use Topics  . Alcohol use: Yes    Comment: occas wine.   . Drug use: No    Family History  Problem Relation Age of Onset  . Heart attack Mother   . Prostate cancer Father   . Heart attack Brother   . Heart attack Maternal Grandmother   . Heart attack Maternal Grandfather     No Known Allergies  Medication list has been reviewed and updated.  Current Outpatient Medications on File Prior to Visit  Medication Sig Dispense Refill  . Multiple Vitamin (MULTIVITAMIN) tablet Take 1 tablet by mouth daily.    . valACYclovir (VALTREX) 1000 MG tablet Take one pill daily for 5 days.  Use as needed for herpes outbreak 20 tablet 0   No current facility-administered medications on file prior to visit.     Review of Systems:  As per HPI- otherwise negative. No nausea or vomiting No true fever    Physical Examination: Vitals:   08/18/17 0833  BP:  130/70  Pulse: 70  Resp: 16  Temp: 98.5 F (36.9 C)  SpO2: 95%   Vitals:   08/18/17 0833  Weight: 224 lb 12.8 oz (102 kg)  Height:  (1.676 m)   Body mass index is 36.28 kg/m. Ideal Body Weight: Weight in (lb) to have BMI = 25: 154.6  GEN: WDWN, NAD, Non-toxic, A & O x 3, overweight, looks well  HEENT: Atraumatic, Normocephalic. Neck supple. No masses, No LAD.  Bilateral TM wnl, oropharynx normal.  PEERL,EOMI.   Nasal congestion on the left  She has what appears to be a superficial telangectasia under her left eye  Ears and Nose: No external deformity. CV: RRR, No M/G/R. No JVD. No thrill. No extra heart sounds. PULM: CTA B, no wheezes, crackles, rhonchi. No retractions. No resp. distress. No accessory muscle use. EXTR: No  c/c/e NEURO Normal gait.  PSYCH: Normally interactive. Conversant. Not depressed or anxious appearing.  Calm demeanor.  Very talkative    Assessment and Plan: Chronic frontal sinusitis - Plan: amoxicillin (AMOXIL) 500 MG capsule  Skin lesion of face - Plan: Ambulatory referral to Dermatology  Treat for sinus infection with amoxicillin- for the time being she declines prednisone  Dermatology referral   It was good to see you today- I will treat for you a sinus infection with amoxicillin for 10 days You can continue your nasal treatments and allergy meds as needed However, if your symptoms do not resolve prednisone may be helpful- let me now if you need this.  Also, I will have you see dermatology to look at the spot under your left eye  Let me know if your symptoms do not resolve in a week or so- Sooner if worse.      Signed Abbe Amsterdam, MD

## 2017-08-18 NOTE — Telephone Encounter (Signed)
Copied from CRM [id number]. Topic: General - Other >> Aug 18, 2017  9:22 AM Oneal Grout wrote: Reason for CRM: On AVS it was documented that she weighed 224, states she only weighed 191 at visit. Can this be corrected. Please advise

## 2017-08-18 NOTE — Telephone Encounter (Signed)
Weight was corrected in patients chart. New AVS has been mailed to patient.

## 2017-08-26 ENCOUNTER — Encounter: Payer: Self-pay | Admitting: Family Medicine

## 2017-09-15 ENCOUNTER — Encounter: Payer: Self-pay | Admitting: Family Medicine

## 2017-10-30 ENCOUNTER — Encounter: Payer: Self-pay | Admitting: Family Medicine

## 2017-10-30 ENCOUNTER — Ambulatory Visit: Payer: Managed Care, Other (non HMO) | Admitting: Family Medicine

## 2017-10-30 VITALS — BP 128/88 | HR 70 | Temp 98.6°F | Resp 16 | Ht 66.0 in | Wt 194.2 lb

## 2017-10-30 DIAGNOSIS — J0111 Acute recurrent frontal sinusitis: Secondary | ICD-10-CM | POA: Diagnosis not present

## 2017-10-30 DIAGNOSIS — R350 Frequency of micturition: Secondary | ICD-10-CM

## 2017-10-30 LAB — POC URINALSYSI DIPSTICK (AUTOMATED)
BILIRUBIN UA: NEGATIVE
Glucose, UA: NEGATIVE
KETONES UA: NEGATIVE
Leukocytes, UA: NEGATIVE
NITRITE UA: NEGATIVE
PH UA: 6.5 (ref 5.0–8.0)
PROTEIN UA: NEGATIVE
RBC UA: NEGATIVE
Spec Grav, UA: 1.01 (ref 1.010–1.025)
Urobilinogen, UA: 0.2 E.U./dL

## 2017-10-30 MED ORDER — AMOXICILLIN-POT CLAVULANATE 875-125 MG PO TABS: 1.0000 | ORAL_TABLET | Freq: Two times a day (BID) | ORAL | 0 refills | Status: DC

## 2017-10-30 NOTE — Patient Instructions (Addendum)
I will be in touch with your urine culture asap- in the meantime we will treat you with augmentin for 10 days The antibiotic will also treat for a sinus infection- if this continues to be an issue we can have you do a CT

## 2017-10-30 NOTE — Progress Notes (Addendum)
[facility] [address], [address] [place], [place] [address] [phone] [phone]  Date:  10/30/2017   Name:  Holly Ramirez   DOB:  1960-10-08   MRN:  [id number]  PCP:  Pearline Cablesopland, Milford Cilento C, MD    Chief Complaint: Sinus Pressure (headache, ear ache-left ear, popping, trouble hearing, swollen lymph nodes, comes and goes) and Urinary Tract Infection (frequency, worse at night, discomfort, hestitancy, bloated, takign otc urinary health)   History of Present Illness:  Holly Ramirez is a 57 y.o. very pleasant female patient who presents with the following:  Here today for a sick visit- she is concerned about a UTI over the last couple of weeks - got better and then returned She has noted sx of frequency and bladder tenderness She is treating herself with cranberry juice, etc- seemed to get better but then relapsed  also she has noted more sinus symptoms again - I had seen her for this in May and we treated with amox.  She got well, but then her sinus sx seemed to return about 1-2 week ago She notes sx mostly on her left side. She notes some pressure and congestion but not mucus from her nose at this time   She does have a history of frequent sinus infections She was given allergy shots in the past She is not sure if she is having a fever but she may feel hot and chilled at night  Never had any sinus surgery but has been told that she had a deviated septum in the past  At this time she is not interested in doing any sort of sinus surgery so we will hold off on an ENT referral or CT   Patient Active Problem List   Diagnosis Date Noted  . Situational stress 01/29/2017  . 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 CHEST PAIN UNSPECIFIED 01/09/2010    No past medical history on file.  Past Surgical History:  Procedure Laterality Date  . CESAREAN SECTION     x2  . TONSILLECTOMY      Social History   Tobacco Use   . Smoking status: Never Smoker  . Smokeless tobacco: Never Used  . Tobacco comment: tobacco use- no   Substance Use Topics  . Alcohol use: Yes    Comment: occas wine.   . Drug use: No    Family History  Problem Relation Age of Onset  . Heart attack Mother   . Prostate cancer Father   . Heart attack Brother   . Heart attack Maternal Grandmother   . Heart attack Maternal Grandfather     No Known Allergies  Medication list has been reviewed and updated.  Current Outpatient Medications on File Prior to Visit  Medication Sig Dispense Refill  . Multiple Vitamin (MULTIVITAMIN) tablet Take 1 tablet by mouth daily.    . valACYclovir (VALTREX) 1000 MG tablet Take one pill daily for 5 days.  Use as needed for herpes outbreak 20 tablet 0   No current facility-administered medications on file prior to visit.     Review of Systems:  As per HPI- otherwise negative. No vomiting or diarrhea No SOB or CP   Physical Examination: Vitals:   10/30/17 1049  BP: 128/88  Pulse: 70  Resp: 16  Temp: 98.6 F (37 C)  SpO2: 95%   Vitals:   10/30/17 1049  Weight: 194 lb 3.2 oz (88.1 kg)  Height:  5\' 6"  (1.676 m)   Body mass index is 31.34 kg/m. Ideal Body Weight: Weight in (lb) to have BMI = 25: 154.6  GEN: WDWN, NAD, Non-toxic, A & O x 3, looks well, overweight  HEENT: Atraumatic, Normocephalic. Neck supple. No masses, No LAD. Bilateral TM wnl, oropharynx normal.  PEERL,EOMI.    Nasal cavity is normal to my exam today Ears and Nose: No external deformity. CV: RRR, No M/G/R. No JVD. No thrill. No extra heart sounds. PULM: CTA B, no wheezes, crackles, rhonchi. No retractions. No resp. distress. No accessory muscle use. ABD: S, ND. No rebound. No HSM.  She notes minimal tenderness over her bladder on palpation today  EXTR: No c/c/e NEURO Normal gait.  PSYCH: Normally interactive. Conversant. Not depressed or anxious appearing.  Calm demeanor.   Results for orders placed or performed  in visit on 10/30/17  POCT Urinalysis Dipstick (Automated)  Result Value Ref Range   Color, UA yellow    Clarity, UA clear    Glucose, UA Negative Negative   Bilirubin, UA negative    Ketones, UA negative    Spec Grav, UA 1.010 1.010 - 1.025   Blood, UA negative    pH, UA 6.5 5.0 - 8.0   Protein, UA Negative Negative   Urobilinogen, UA 0.2 0.2 or 1.0 E.U./dL   Nitrite, UA negative    Leukocytes, UA Negative Negative    Assessment and Plan: Urinary frequency - Plan: POCT Urinalysis Dipstick (Automated), Urine Culture, amoxicillin-clavulanate (AUGMENTIN) 875-125 MG tablet  Acute recurrent frontal sinusitis - Plan: amoxicillin-clavulanate (AUGMENTIN) 875-125 MG tablet  UTI sx- her dip is benign but sx are typical and she is quite well hydrated.  Will start on treatment while we await culture Treat with augmentin which will cover most e coli UTI and also potential sinusitis  Await her urine culture and will be in touch with her Treat for recurrent sinusitis sx If she continued to have sinus sx we will plan for a CT scan   Signed Abbe Amsterdam, MD  Received her urine culture 8/3- Results for orders placed or performed in visit on 10/30/17  Urine Culture  Result Value Ref Range   MICRO NUMBER: [id number]    SPECIMEN QUALITY: ADEQUATE    Sample Source NOT GIVEN    STATUS: FINAL    Result:      Multiple organisms present, each less than 10,000 CFU/mL. These organisms, commonly found on external and internal genitalia, are considered to be colonizers. No further testing performed.  POCT Urinalysis Dipstick (Automated)  Result Value Ref Range   Color, UA yellow    Clarity, UA clear    Glucose, UA Negative Negative   Bilirubin, UA negative    Ketones, UA negative    Spec Grav, UA 1.010 1.010 - 1.025   Blood, UA negative    pH, UA 6.5 5.0 - 8.0   Protein, UA Negative Negative   Urobilinogen, UA 0.2 0.2 or 1.0 E.U./dL   Nitrite, UA negative    Leukocytes, UA Negative Negative    Message to pt

## 2017-10-31 ENCOUNTER — Other Ambulatory Visit: Payer: Self-pay | Admitting: Family Medicine

## 2017-10-31 DIAGNOSIS — Z1231 Encounter for screening mammogram for malignant neoplasm of breast: Secondary | ICD-10-CM

## 2017-10-31 LAB — URINE CULTURE
MICRO NUMBER: 90910634
SPECIMEN QUALITY:: ADEQUATE

## 2017-11-01 ENCOUNTER — Encounter: Payer: Self-pay | Admitting: Family Medicine

## 2017-11-03 ENCOUNTER — Ambulatory Visit: Payer: Managed Care, Other (non HMO) | Admitting: Family Medicine

## 2017-11-12 ENCOUNTER — Encounter: Payer: Self-pay | Admitting: Family Medicine

## 2017-11-27 ENCOUNTER — Ambulatory Visit: Payer: Managed Care, Other (non HMO)

## 2017-12-04 ENCOUNTER — Ambulatory Visit
Admission: RE | Admit: 2017-12-04 | Discharge: 2017-12-04 | Disposition: A | Discharge status: Home / Self Care | Payer: Managed Care, Other (non HMO) | Source: Ambulatory Visit | Attending: Family Medicine | Admitting: Family Medicine

## 2017-12-04 DIAGNOSIS — Z1231 Encounter for screening mammogram for malignant neoplasm of breast: Secondary | ICD-10-CM

## 2017-12-31 ENCOUNTER — Encounter: Payer: Self-pay | Admitting: Family Medicine

## 2018-01-01 ENCOUNTER — Encounter: Payer: Self-pay | Admitting: Family Medicine

## 2018-01-01 DIAGNOSIS — J329 Chronic sinusitis, unspecified: Secondary | ICD-10-CM

## 2018-01-02 ENCOUNTER — Encounter: Payer: Self-pay | Admitting: Family Medicine

## 2018-04-09 ENCOUNTER — Encounter: Payer: Managed Care, Other (non HMO) | Admitting: Family Medicine

## 2018-04-11 NOTE — Progress Notes (Deleted)
[facility] [address] [place], [place] [address] [phone]  Date:  04/16/2018   Name:  Holly Ramirez   DOB:  [birth year]   MRN:  [id number]  PCP:  Holly Cables, MD    Chief Complaint: No chief complaint on file.   History of Present Illness:  Holly Ramirez is a 58 y.o. very pleasant female patient who presents with the following:  Here today for a physical exam Last seen by myself in August  Labs: due Pap: 11/18, normal Mammogram: 9/19 Colon cancer screening: Cologuard done August 2017, will be due later this year Flu shot: Shingrix:  Patient Active Problem List   Diagnosis Date Noted  . Situational stress 01/29/2017  . 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 CHEST PAIN UNSPECIFIED 01/09/2010    No past medical history on file.  Past Surgical History:  Procedure Laterality Date  . CESAREAN SECTION     x2  . TONSILLECTOMY      Social History   Tobacco Use  . Smoking status: Never Smoker  . Smokeless tobacco: Never Used  . Tobacco comment: tobacco use- no   Substance Use Topics  . Alcohol use: Yes    Comment: occas wine.   . Drug use: No    Family History  Problem Relation Age of Onset  . Heart attack Mother   . Prostate cancer Father   . Heart attack Brother   . Heart attack Maternal Grandmother   . Heart attack Maternal Grandfather     No Known Allergies  Medication list has been reviewed and updated.  Current Outpatient Medications on File Prior to Visit  Medication Sig Dispense Refill  . amoxicillin-clavulanate (AUGMENTIN) 875-125 MG tablet Take 1 tablet by mouth 2 (two) times daily. 20 tablet 0  . Multiple Vitamin (MULTIVITAMIN) tablet Take 1 tablet by mouth daily.    . valACYclovir (VALTREX) 1000 MG tablet Take one pill daily for 5 days.  Use as needed for herpes outbreak 20 tablet 0   No current facility-administered medications on file  prior to visit.     Review of Systems:  As per HPI- otherwise negative.   Physical Examination: There were no vitals filed for this visit. There were no vitals filed for this visit. There is no height or weight on file to calculate BMI. Ideal Body Weight:    GEN: WDWN, NAD, Non-toxic, A & O x 3 HEENT: Atraumatic, Normocephalic. Neck supple. No masses, No LAD. Ears and Nose: No external deformity. CV: RRR, No M/G/R. No JVD. No thrill. No extra heart sounds. PULM: CTA B, no wheezes, crackles, rhonchi. No retractions. No resp. distress. No accessory muscle use. ABD: S, NT, ND, +BS. No rebound. No HSM. EXTR: No c/c/e NEURO Normal gait.  PSYCH: Normally interactive. Conversant. Not depressed or anxious appearing.  Calm demeanor.    Assessment and Plan: ***  Signed Abbe Amsterdam, MD

## 2018-04-16 ENCOUNTER — Encounter: Payer: Managed Care, Other (non HMO) | Admitting: Family Medicine

## 2018-04-25 NOTE — Progress Notes (Addendum)
[facility] [address], [address] [place], [place] [address] [phone] [phone]  Date:  04/29/2018   Name:  Holly Ramirez   DOB:  02/16/1961   MRN:  [id number]  PCP:  Pearline Cablesopland, Bradyn Vassey C, MD    Chief Complaint: Annual Exam (declines flu shot)   History of Present Illness:  Holly Ramirez is a 58 y.o. very pleasant female patient who presents with the following:  [name] is here today for physical exam.  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  She had some coffee this am  Immunizations: Flu- declines today  Can suggest Shingrix; will start for her today   Also has 2 daughters, her oldest Holly Ramirez is in college.  Her younger daughter Holly Guarnerisabella is a teenager who has suffered with a lot of depression and anxiety Her sx are such that Holly Ramirez cannot hold a job, she is so often called to school to pick up her daughter   Wt Readings from Last 3 Encounters:  04/29/18 194 lb (88 kg)  10/30/17 194 lb 3.2 oz (88.1 kg)  08/18/17 191 lb (86.6 kg)   She is following her BP at home; she notes that her numbers have been good so she stopped checking her BP  She notes that she is under stress with her daughter's health issues  She did see derm and was dx with some skin cancers on her head - these were removed  She feels like she might have a chronic stye in her left eye for several months; she had a larger stye which resolved with hot compresses, but continues to notice a little white spot on the inside of her lower lid.  It is not otherwise symptomatic She feels like there might be some intermittent swelling in the left side of her  neck- she sees this in the mirror but is not sure if this is just fat   Family history of thyroid disorder She notes sx of hot flashes Cannot lose weight Patient Active Problem List   Diagnosis Date Noted  . Situational stress 01/29/2017  . 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 CHEST PAIN UNSPECIFIED 01/09/2010    No past medical history on file.  Past Surgical History:  Procedure Laterality Date  . CESAREAN SECTION     x2  . TONSILLECTOMY      Social History   Tobacco Use  . Smoking status: Never Smoker  . Smokeless tobacco: Never Used  . Tobacco comment: tobacco use- no   Substance Use Topics  . Alcohol use: Yes    Comment: occas wine.   . Drug use: No    Family History  Problem Relation Age of Onset  . Heart attack Mother   . Prostate cancer Father   . Heart attack Brother   . Heart attack Maternal Grandmother   . Heart attack Maternal Grandfather     No Known Allergies  Medication list has been reviewed and updated.  No current outpatient medications on file prior to visit.   No current facility-administered medications on file prior to visit.     Review  of Systems:  As per HPI- otherwise negative. No postmenopausal bleeding, no chest pain or shortness of breath with exercise  Physical Examination: Vitals:   04/29/18 0953  BP: 126/90  Pulse: 74  Resp: 16  Temp: 98 F (36.7 C)  SpO2: 95%   Vitals:   04/29/18 0953  Weight: 194 lb (88 kg)  Height: 5\' 6"  (1.676 m)   Body mass index is 31.31 kg/m. Ideal Body Weight: Weight in (lb) to have BMI = 25: 154.6  GEN: WDWN, NAD, Non-toxic, A & O x 3, obese, looks well  HEENT: Atraumatic, Normocephalic. Neck supple. No masses, No LAD.  Bilateral TM wnl, oropharynx normal.  PEERL,EOMI. the internal aspect of the left lower lid displays a tiny ?retained sebaceous nodule right at the lash line I do not appreciate any abnormality of her jaw or neck, no apparent lymphadenopathy Ears and  Nose: No external deformity. CV: RRR, No M/G/R. No JVD. No thrill. No extra heart sounds. PULM: CTA B, no wheezes, crackles, rhonchi. No retractions. No resp. distress. No accessory muscle use. ABD: S, NT, ND, +BS. No rebound. No HSM. EXTR: No c/c/e NEURO Normal gait.  PSYCH: Normally interactive. Conversant. Not depressed or anxious appearing.  Calm demeanor.    Assessment and Plan: Physical exam  Screening for hyperlipidemia - Plan: Lipid panel  Screening for deficiency anemia - Plan: CBC  Screening for diabetes mellitus - Plan: Comprehensive metabolic panel, Hemoglobin A1c  Immunization due - Plan: Varicella-zoster vaccine IM (Shingrix)  Family history of thyroid disorder - Plan: TSH  Eyelid abnormality - Plan: Ambulatory referral to Ophthalmology  Here today for complete physical.  She has noted a abnormality of her left inner lower eyelid.  Will refer to ophthalmology, suspect they can remove this little bit of sebaceous material.  Declines flu shot today.  Given shingles vaccine #1 today. Labs pending, will be in touch with these results  Signed Abbe Amsterdam, MD  Received her labs, message to patient  Results for orders placed or performed in visit on 04/29/18  CBC  Result Value Ref Range   WBC 5.6 4.0 - 10.5 K/uL   RBC 4.41 3.87 - 5.11 Mil/uL   Platelets 355.0 150.0 - 400.0 K/uL   Hemoglobin 13.1 12.0 - 15.0 g/dL   HCT 67.5 91.6 - 38.4 %   MCV 88.2 78.0 - 100.0 fl   MCHC 33.8 30.0 - 36.0 g/dL   RDW 66.5 99.3 - 57.0 %  Comprehensive metabolic panel  Result Value Ref Range   Sodium 139 135 - 145 mEq/L   Potassium 4.4 3.5 - 5.1 mEq/L   Chloride 102 96 - 112 mEq/L   CO2 28 19 - 32 mEq/L   Glucose, Bld 88 70 - 99 mg/dL   BUN 15 6 - 23 mg/dL   Creatinine, Ser 1.77 0.40 - 1.20 mg/dL   Total Bilirubin 0.5 0.2 - 1.2 mg/dL   Alkaline Phosphatase 70 39 - 117 U/L   AST 20 0 - 37 U/L   ALT 22 0 - 35 U/L   Total Protein 7.5 6.0 - 8.3 g/dL   Albumin 4.7 3.5 - 5.2  g/dL   Calcium 93.9 8.4 - 03.0 mg/dL   GFR 092.33 >00.76 mL/min  Hemoglobin A1c  Result Value Ref Range   Hgb A1c MFr Bld 5.8 4.6 - 6.5 %  Lipid panel  Result Value Ref Range   Cholesterol 183 0 - 200 mg/dL   Triglycerides 22.6 0.0 - 149.0 mg/dL  HDL 64.30 >39.00 mg/dL   VLDL 16.118.4 0.0 - 09.640.0 mg/dL   LDL Cholesterol 045101 (H) 0 - 99 mg/dL   Total CHOL/HDL Ratio 3    NonHDL 119.03   TSH  Result Value Ref Range   TSH 1.56 0.35 - 4.50 uIU/mL   \

## 2018-04-29 ENCOUNTER — Ambulatory Visit (INDEPENDENT_AMBULATORY_CARE_PROVIDER_SITE_OTHER): Payer: Managed Care, Other (non HMO) | Admitting: Family Medicine

## 2018-04-29 ENCOUNTER — Encounter: Payer: Self-pay | Admitting: Family Medicine

## 2018-04-29 VITALS — BP 126/90 | HR 74 | Temp 98.0°F | Resp 16 | Ht 66.0 in | Wt 194.0 lb

## 2018-04-29 DIAGNOSIS — Z0000 Encounter for general adult medical examination without abnormal findings: Secondary | ICD-10-CM

## 2018-04-29 DIAGNOSIS — Z130 Encounter for screening for diseases of the blood and blood-forming organs and certain disorders involving the immune mechanism: Secondary | ICD-10-CM | POA: Diagnosis not present

## 2018-04-29 DIAGNOSIS — Z8349 Family history of other endocrine, nutritional and metabolic diseases: Secondary | ICD-10-CM

## 2018-04-29 DIAGNOSIS — H029 Unspecified disorder of eyelid: Secondary | ICD-10-CM

## 2018-04-29 DIAGNOSIS — Z131 Encounter for screening for diabetes mellitus: Secondary | ICD-10-CM

## 2018-04-29 DIAGNOSIS — Z23 Encounter for immunization: Secondary | ICD-10-CM

## 2018-04-29 DIAGNOSIS — Z13220 Encounter for screening for lipoid disorders: Secondary | ICD-10-CM

## 2018-04-29 LAB — CBC
HCT: 38.9 % (ref 36.0–46.0)
Hemoglobin: 13.1 g/dL (ref 12.0–15.0)
MCHC: 33.8 g/dL (ref 30.0–36.0)
MCV: 88.2 fl (ref 78.0–100.0)
PLATELETS: 355 10*3/uL (ref 150.0–400.0)
RBC: 4.41 Mil/uL (ref 3.87–5.11)
RDW: 14.4 % (ref 11.5–15.5)
WBC: 5.6 10*3/uL (ref 4.0–10.5)

## 2018-04-29 LAB — COMPREHENSIVE METABOLIC PANEL
ALBUMIN: 4.7 g/dL (ref 3.5–5.2)
ALK PHOS: 70 U/L (ref 39–117)
ALT: 22 U/L (ref 0–35)
AST: 20 U/L (ref 0–37)
BILIRUBIN TOTAL: 0.5 mg/dL (ref 0.2–1.2)
BUN: 15 mg/dL (ref 6–23)
CALCIUM: 10 mg/dL (ref 8.4–10.5)
CO2: 28 mEq/L (ref 19–32)
CREATININE: 0.58 mg/dL (ref 0.40–1.20)
Chloride: 102 mEq/L (ref 96–112)
GFR: 107.12 mL/min (ref >60.00)
Glucose, Bld: 88 mg/dL (ref 70–99)
Potassium: 4.4 mEq/L (ref 3.5–5.1)
Sodium: 139 mEq/L (ref 135–145)
Total Protein: 7.5 g/dL (ref 6.0–8.3)

## 2018-04-29 LAB — LIPID PANEL
CHOLESTEROL: 183 mg/dL (ref 0–200)
HDL: 64.3 mg/dL (ref >39.00)
LDL Cholesterol: 101 mg/dL — ABNORMAL HIGH (ref 0–99)
NonHDL: 119.03
Total CHOL/HDL Ratio: 3
Triglycerides: 92 mg/dL (ref 0.0–149.0)
VLDL: 18.4 mg/dL (ref 0.0–40.0)

## 2018-04-29 LAB — HEMOGLOBIN A1C: HEMOGLOBIN A1C: 5.8 % (ref 4.6–6.5)

## 2018-04-29 LAB — TSH: TSH: 1.56 u[IU]/mL (ref 0.35–4.50)

## 2018-04-29 NOTE — Patient Instructions (Signed)
It was good to see today, I will be in touch with your labs ASAP. Please send me a message in July or August, and I will order your Cologuard for you.  This is due later on this year I am going to refer you to an ophthalmologist to look at the area on your right lower lid.  This looks like a clogged pore, or retained sebaceous material.  I think ophthalmology can probably fix this for you Please come in for your second shingles vaccine in 2 to 6 months.  This can be done as a nurse visit only  Let me know if I can be of any other assistance to you  Health Maintenance, Female Adopting a healthy lifestyle and getting preventive care can go a long way to promote health and wellness. Talk with your health care provider about what schedule of regular examinations is right for you. This is a good chance for you to check in with your provider about disease prevention and staying healthy. In between checkups, there are plenty of things you can do on your own. Experts have done a lot of research about which lifestyle changes and preventive measures are most likely to keep you healthy. Ask your health care provider for more information. Weight and diet Eat a healthy diet  Be sure to include plenty of vegetables, fruits, low-fat dairy products, and lean protein.  Do not eat a lot of foods high in solid fats, added sugars, or salt.  Get regular exercise. This is one of the most important things you can do for your health. ? Most adults should exercise for at least 150 minutes each week. The exercise should increase your heart rate and make you sweat (moderate-intensity exercise). ? Most adults should also do strengthening exercises at least twice a week. This is in addition to the moderate-intensity exercise. Maintain a healthy weight  Body mass index (BMI) is a measurement that can be used to identify possible weight problems. It estimates body fat based on height and weight. Your health care provider can  help determine your BMI and help you achieve or maintain a healthy weight.  For females 42 years of age and older: ? A BMI below 18.5 is considered underweight. ? A BMI of 18.5 to 24.9 is normal. ? A BMI of 25 to 29.9 is considered overweight. ? A BMI of 30 and above is considered obese. Watch levels of cholesterol and blood lipids  You should start having your blood tested for lipids and cholesterol at 58 years of age, then have this test every 5 years.  You may need to have your cholesterol levels checked more often if: ? Your lipid or cholesterol levels are high. ? You are older than 58 years of age. ? You are at high risk for heart disease. Cancer screening Lung Cancer  Lung cancer screening is recommended for adults 70-62 years old who are at high risk for lung cancer because of a history of smoking.  A yearly low-dose CT scan of the lungs is recommended for people who: ? Currently smoke. ? Have quit within the past 15 years. ? Have at least a 30-pack-year history of smoking. A pack year is smoking an average of one pack of cigarettes a day for 1 year.  Yearly screening should continue until it has been 15 years since you quit.  Yearly screening should stop if you develop a health problem that would prevent you from having lung cancer treatment. Breast Cancer  Practice breast self-awareness. This means understanding how your breasts normally appear and feel.  It also means doing regular breast self-exams. Let your health care provider know about any changes, no matter how small.  If you are in your 20s or 30s, you should have a clinical breast exam (CBE) by a health care provider every 1-3 years as part of a regular health exam.  If you are 68 or older, have a CBE every year. Also consider having a breast X-ray (mammogram) every year.  If you have a family history of breast cancer, talk to your health care provider about genetic screening.  If you are at high risk for  breast cancer, talk to your health care provider about having an MRI and a mammogram every year.  Breast cancer gene (BRCA) assessment is recommended for women who have family members with BRCA-related cancers. BRCA-related cancers include: ? Breast. ? Ovarian. ? Tubal. ? Peritoneal cancers.  Results of the assessment will determine the need for genetic counseling and BRCA1 and BRCA2 testing. Cervical Cancer Your health care provider may recommend that you be screened regularly for cancer of the pelvic organs (ovaries, uterus, and vagina). This screening involves a pelvic examination, including checking for microscopic changes to the surface of your cervix (Pap test). You may be encouraged to have this screening done every 3 years, beginning at age 79.  For women ages 78-65, health care providers may recommend pelvic exams and Pap testing every 3 years, or they may recommend the Pap and pelvic exam, combined with testing for human papilloma virus (HPV), every 5 years. Some types of HPV increase your risk of cervical cancer. Testing for HPV may also be done on women of any age with unclear Pap test results.  Other health care providers may not recommend any screening for nonpregnant women who are considered low risk for pelvic cancer and who do not have symptoms. Ask your health care provider if a screening pelvic exam is right for you.  If you have had past treatment for cervical cancer or a condition that could lead to cancer, you need Pap tests and screening for cancer for at least 20 years after your treatment. If Pap tests have been discontinued, your risk factors (such as having a new sexual partner) need to be reassessed to determine if screening should resume. Some women have medical problems that increase the chance of getting cervical cancer. In these cases, your health care provider may recommend more frequent screening and Pap tests. Colorectal Cancer  This type of cancer can be  detected and often prevented.  Routine colorectal cancer screening usually begins at 58 years of age and continues through 58 years of age.  Your health care provider may recommend screening at an earlier age if you have risk factors for colon cancer.  Your health care provider may also recommend using home test kits to check for hidden blood in the stool.  A small camera at the end of a tube can be used to examine your colon directly (sigmoidoscopy or colonoscopy). This is done to check for the earliest forms of colorectal cancer.  Routine screening usually begins at age 50.  Direct examination of the colon should be repeated every 5-10 years through 58 years of age. However, you may need to be screened more often if early forms of precancerous polyps or small growths are found. Skin Cancer  Check your skin from head to toe regularly.  Tell your health care provider about any  new moles or changes in moles, especially if there is a change in a mole's shape or color.  Also tell your health care provider if you have a mole that is larger than the size of a pencil eraser.  Always use sunscreen. Apply sunscreen liberally and repeatedly throughout the day.  Protect yourself by wearing long sleeves, pants, a wide-brimmed hat, and sunglasses whenever you are outside. Heart disease, diabetes, and high blood pressure  High blood pressure causes heart disease and increases the risk of stroke. High blood pressure is more likely to develop in: ? People who have blood pressure in the high end of the normal range (130-139/85-89 mm Hg). ? People who are overweight or obese. ? People who are African American.  If you are 20-70 years of age, have your blood pressure checked every 3-5 years. If you are 23 years of age or older, have your blood pressure checked every year. You should have your blood pressure measured twice-once when you are at a hospital or clinic, and once when you are not at a hospital  or clinic. Record the average of the two measurements. To check your blood pressure when you are not at a hospital or clinic, you can use: ? An automated blood pressure machine at a pharmacy. ? A home blood pressure monitor.  If you are between 70 years and 43 years old, ask your health care provider if you should take aspirin to prevent strokes.  Have regular diabetes screenings. This involves taking a blood sample to check your fasting blood sugar level. ? If you are at a normal weight and have a low risk for diabetes, have this test once every three years after 58 years of age. ? If you are overweight and have a high risk for diabetes, consider being tested at a younger age or more often. Preventing infection Hepatitis B  If you have a higher risk for hepatitis B, you should be screened for this virus. You are considered at high risk for hepatitis B if: ? You were born in a country where hepatitis B is common. Ask your health care provider which countries are considered high risk. ? Your parents were born in a high-risk country, and you have not been immunized against hepatitis B (hepatitis B vaccine). ? You have HIV or AIDS. ? You use needles to inject street drugs. ? You live with someone who has hepatitis B. ? You have had sex with someone who has hepatitis B. ? You get hemodialysis treatment. ? You take certain medicines for conditions, including cancer, organ transplantation, and autoimmune conditions. Hepatitis C  Blood testing is recommended for: ? Everyone born from [birth year] through 1965. ? Anyone with known risk factors for hepatitis C. Sexually transmitted infections (STIs)  You should be screened for sexually transmitted infections (STIs) including gonorrhea and chlamydia if: ? You are sexually active and are younger than 58 years of age. ? You are older than 58 years of age and your health care provider tells you that you are at risk for this type of infection. ? Your sexual  activity has changed since you were last screened and you are at an increased risk for chlamydia or gonorrhea. Ask your health care provider if you are at risk.  If you do not have HIV, but are at risk, it may be recommended that you take a prescription medicine daily to prevent HIV infection. This is called pre-exposure prophylaxis (PrEP). You are considered at risk if: ?  You are sexually active and do not regularly use condoms or know the HIV status of your partner(s). ? You take drugs by injection. ? You are sexually active with a partner who has HIV. Talk with your health care provider about whether you are at high risk of being infected with HIV. If you choose to begin PrEP, you should first be tested for HIV. You should then be tested every 3 months for as long as you are taking PrEP. Pregnancy  If you are premenopausal and you may become pregnant, ask your health care provider about preconception counseling.  If you may become pregnant, take 400 to 800 micrograms (mcg) of folic acid every day.  If you want to prevent pregnancy, talk to your health care provider about birth control (contraception). Osteoporosis and menopause  Osteoporosis is a disease in which the bones lose minerals and strength with aging. This can result in serious bone fractures. Your risk for osteoporosis can be identified using a bone density scan.  If you are 46 years of age or older, or if you are at risk for osteoporosis and fractures, ask your health care provider if you should be screened.  Ask your health care provider whether you should take a calcium or vitamin D supplement to lower your risk for osteoporosis.  Menopause may have certain physical symptoms and risks.  Hormone replacement therapy may reduce some of these symptoms and risks. Talk to your health care provider about whether hormone replacement therapy is right for you. Follow these instructions at home:  Schedule regular health, dental, and  eye exams.  Stay current with your immunizations.  Do not use any tobacco products including cigarettes, chewing tobacco, or electronic cigarettes.  If you are pregnant, do not drink alcohol.  If you are breastfeeding, limit how much and how often you drink alcohol.  Limit alcohol intake to no more than 1 drink per day for nonpregnant women. One drink equals 12 ounces of beer, 5 ounces of wine, or 1 ounces of hard liquor.  Do not use street drugs.  Do not share needles.  Ask your health care provider for help if you need support or information about quitting drugs.  Tell your health care provider if you often feel depressed.  Tell your health care provider if you have ever been abused or do not feel safe at home. This information is not intended to replace advice given to you by your health care provider. Make sure you discuss any questions you have with your health care provider. Document Released: 10/01/2010 Document Revised: 08/24/2015 Document Reviewed: 12/20/2014 Elsevier Interactive Patient Education  2019 [employer].

## 2018-07-03 IMAGING — CR DG FOOT COMPLETE 3+V*R*
3 series · 3 of 3 positions shown · non-contrast
Comparison: None in PACs

CLINICAL DATA: Two years of intermittent right foot pain and
swelling at the base of the proximal phalanx of the great toe.

EXAM:
RIGHT FOOT COMPLETE - 3+ VIEW

[t foot ap right]
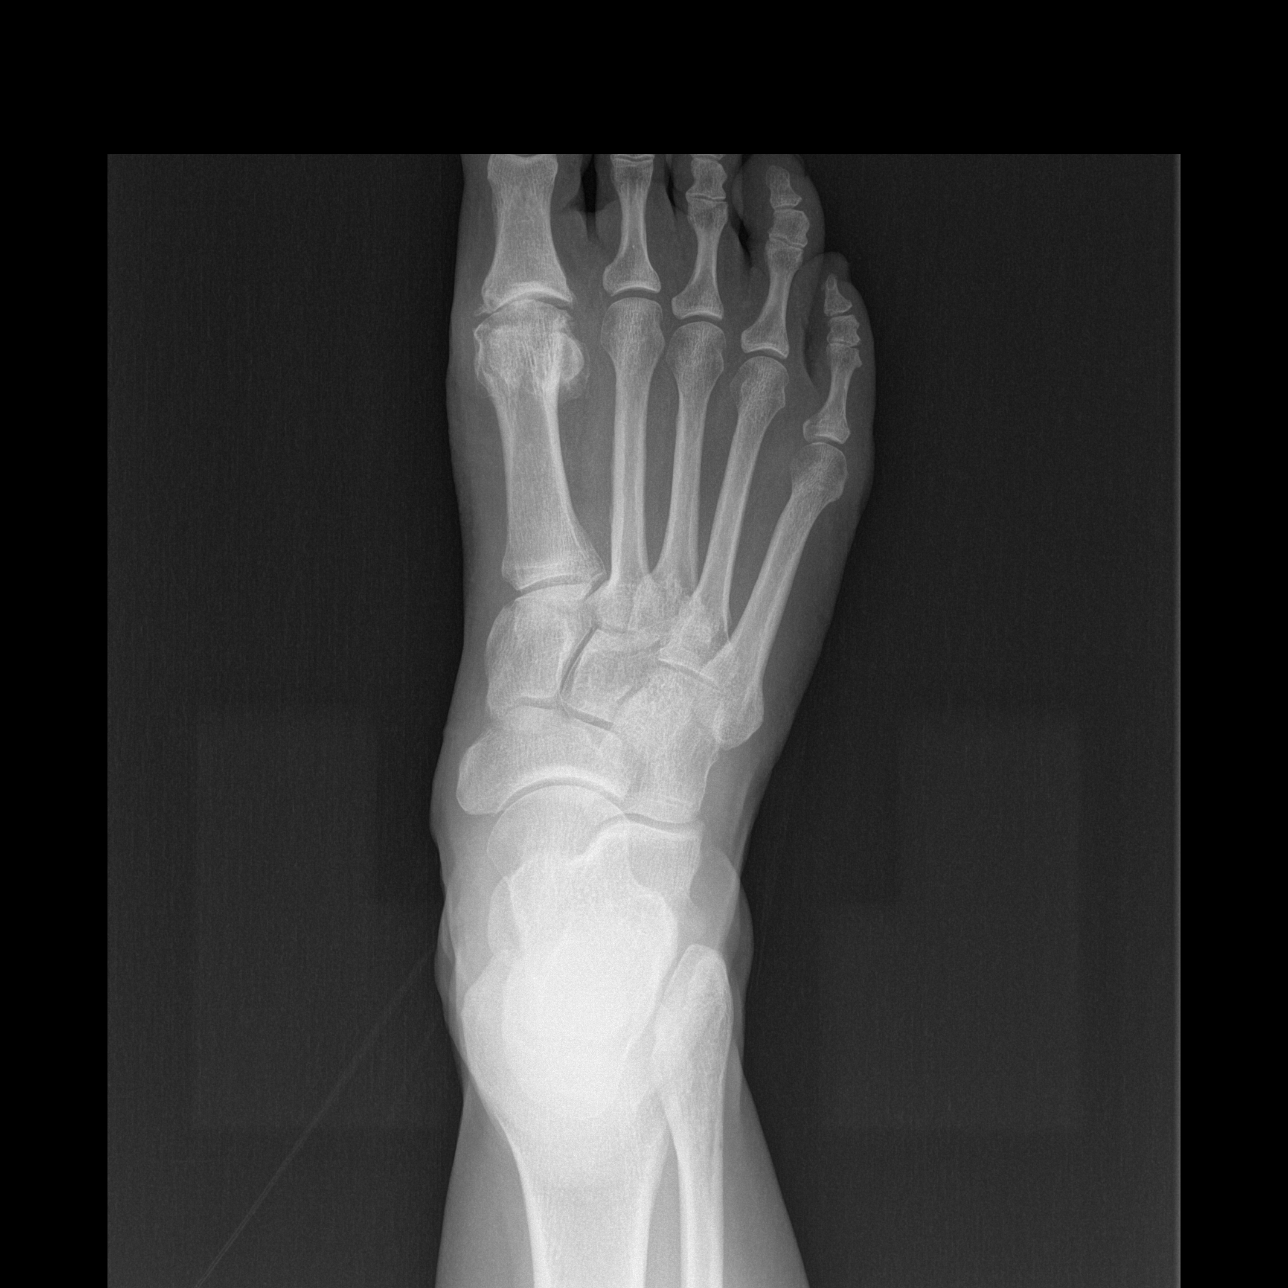

[t foot oblique right]
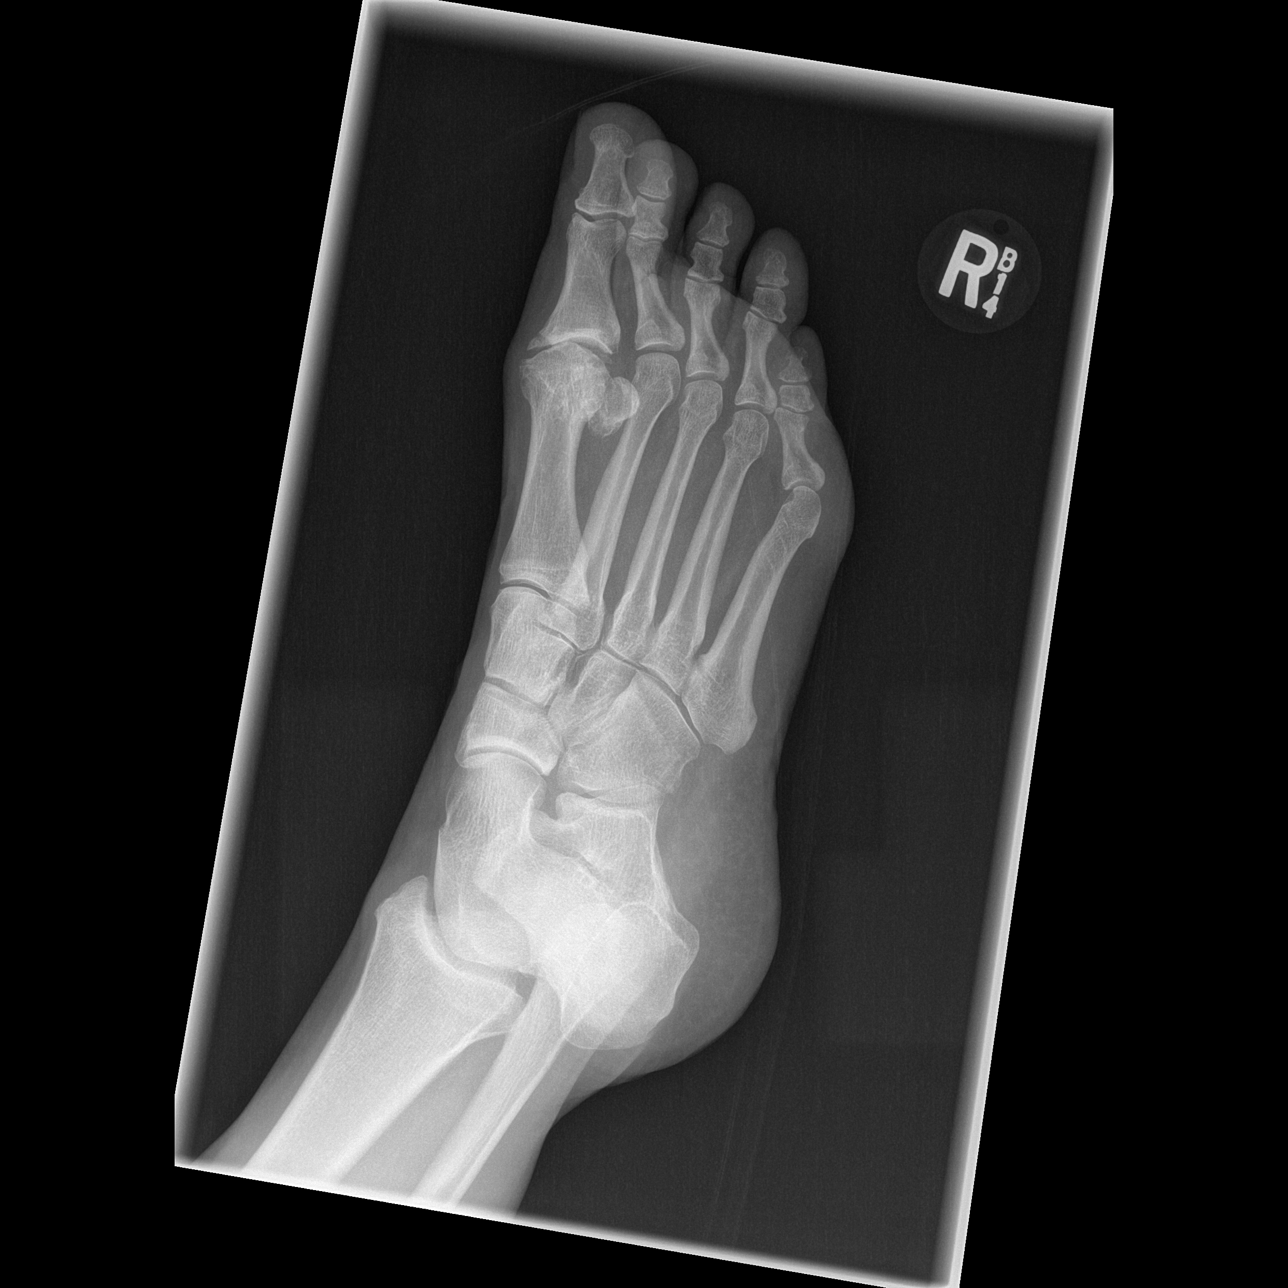

[t foot lat right]
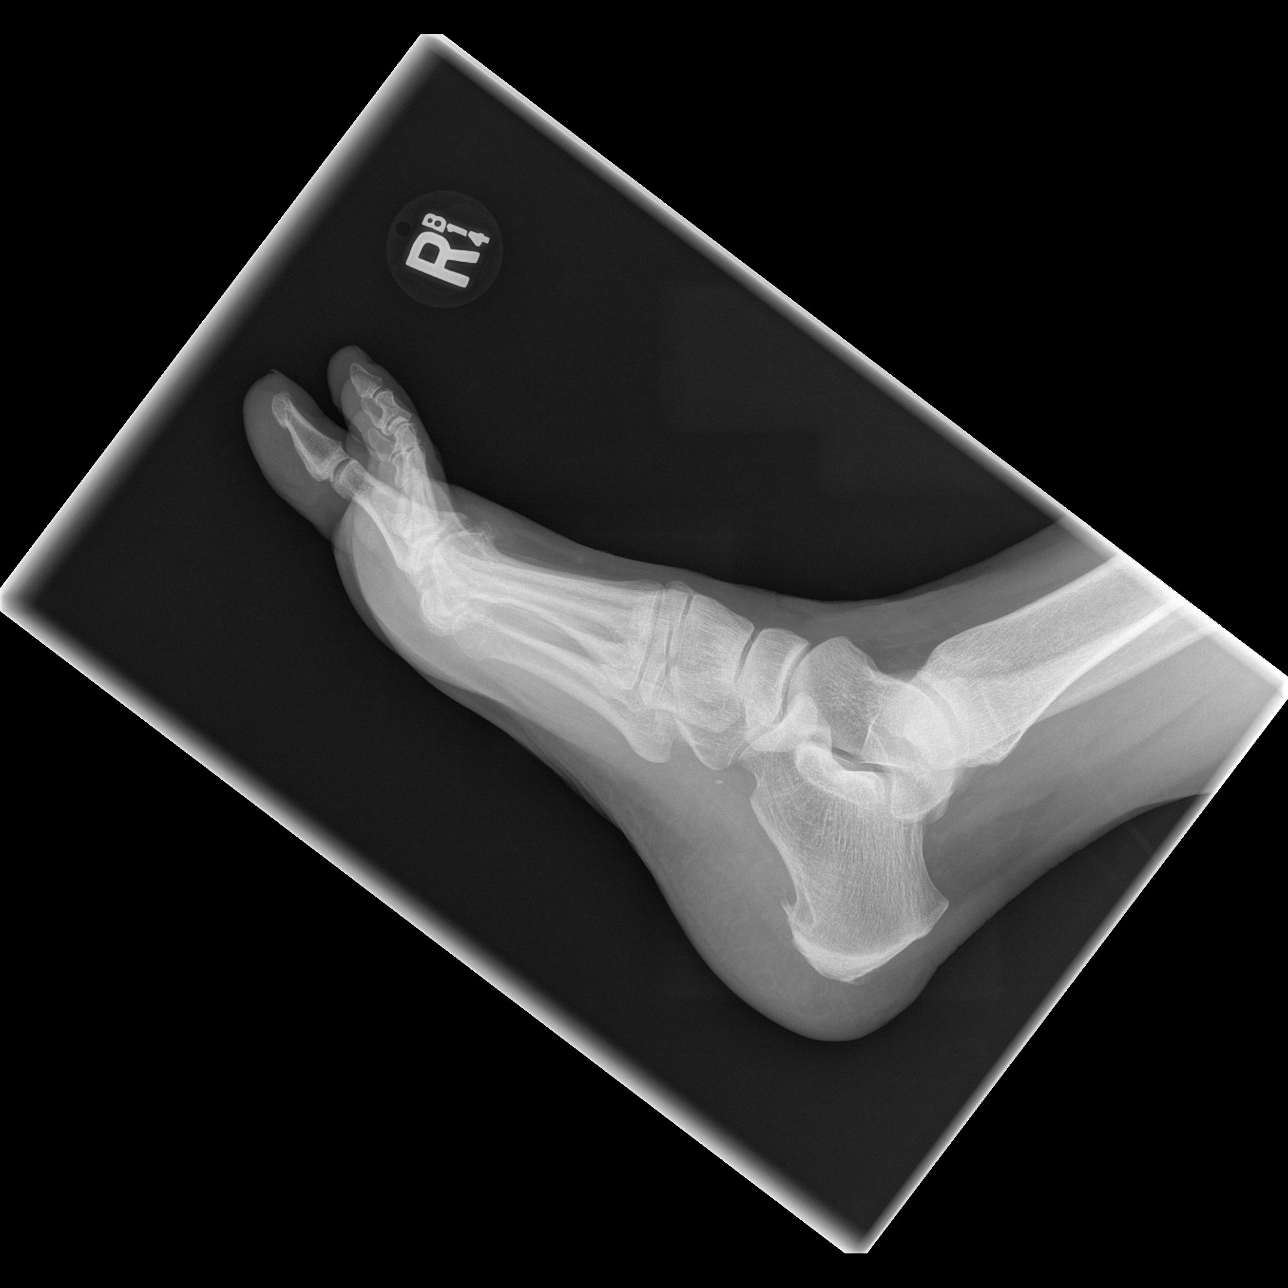

[3 of 3 positions shown; findings below may reference images not displayed]

FINDINGS: The bones of the right foot are subjectively adequately mineralized.
There is moderate degenerative change of the first
metatarsophalangeal joint with asymmetric joint space narrowing and
small marginal osteophytes. The IP joint of the great toe is
unremarkable. The MTP joints and IP joints of the other toes are
normal. The tarsometatarsal and intertarsal joint spaces are well
maintained. There is a plantar calcaneal spur.
IMPRESSION: Moderate osteoarthritic change of the first MTP joint. There is no
acute fracture nor other acute bony abnormality.

## 2018-07-07 ENCOUNTER — Ambulatory Visit: Payer: Managed Care, Other (non HMO)

## 2018-09-16 ENCOUNTER — Encounter: Payer: Self-pay | Admitting: Family Medicine

## 2018-10-06 ENCOUNTER — Encounter: Payer: Self-pay | Admitting: Family Medicine

## 2018-10-07 ENCOUNTER — Encounter: Payer: Self-pay | Admitting: Family Medicine

## 2018-10-07 ENCOUNTER — Telehealth: Payer: Self-pay

## 2018-10-07 NOTE — Telephone Encounter (Signed)
Patient's mychart message was sent to testing pool in error. Patient does NOT need testing. Sorry!

## 2018-10-08 NOTE — Telephone Encounter (Signed)
Noted  

## 2018-12-15 ENCOUNTER — Telehealth: Payer: Self-pay

## 2018-12-15 NOTE — Telephone Encounter (Signed)
Can she have the second shignrix or does she need to start over?

## 2018-12-15 NOTE — Telephone Encounter (Signed)
She can have the 2nd dose, no need to start over

## 2018-12-16 NOTE — Telephone Encounter (Signed)
Ok to schedule her next injections no need to start over.

## 2018-12-24 ENCOUNTER — Ambulatory Visit: Payer: Managed Care, Other (non HMO)

## 2019-01-06 ENCOUNTER — Other Ambulatory Visit: Payer: Self-pay

## 2019-01-06 ENCOUNTER — Ambulatory Visit (INDEPENDENT_AMBULATORY_CARE_PROVIDER_SITE_OTHER): Payer: Managed Care, Other (non HMO)

## 2019-01-06 ENCOUNTER — Encounter: Payer: Self-pay | Admitting: Family Medicine

## 2019-01-06 DIAGNOSIS — Z23 Encounter for immunization: Secondary | ICD-10-CM | POA: Diagnosis not present

## 2019-07-15 ENCOUNTER — Ambulatory Visit: Payer: Self-pay | Attending: Internal Medicine

## 2019-07-15 DIAGNOSIS — Z23 Encounter for immunization: Secondary | ICD-10-CM

## 2019-07-15 NOTE — Progress Notes (Signed)
   Covid-19 Vaccination Clinic  Name:  SARABELLE GENSON    MRN: [id number] DOB: 09/14/1960  07/15/2019  Ms. Durkin was observed post Covid-19 immunization for 15 minutes without incident. She was provided with Vaccine Information Sheet and instruction to access the V-Safe system.   Ms. Witting was instructed to call 911 with any severe reactions post vaccine: Marland Kitchen Difficulty breathing  . Swelling of face and throat  . A fast heartbeat  . A bad rash all over body  . Dizziness and weakness   Immunizations Administered    Name Date Dose VIS Date Route   Pfizer COVID-19 Vaccine 07/15/2019  8:20 AM 0.3 mL 03/12/2019 Intramuscular   Manufacturer: [employer], [employer]   Lot: [id number]   NDC: [address]-3

## 2019-08-09 ENCOUNTER — Ambulatory Visit: Payer: Self-pay | Attending: Internal Medicine

## 2019-08-09 DIAGNOSIS — Z23 Encounter for immunization: Secondary | ICD-10-CM

## 2019-08-09 NOTE — Progress Notes (Signed)
   Covid-19 Vaccination Clinic  Name:  MAZIKEEN HEHN    MRN: [id number] DOB: 1961-03-15  08/09/2019  Ms. Ferrington was observed post Covid-19 immunization for 15 minutes without incident. She was provided with Vaccine Information Sheet and instruction to access the V-Safe system.   Ms. Sicard was instructed to call 911 with any severe reactions post vaccine: Marland Kitchen Difficulty breathing  . Swelling of face and throat  . A fast heartbeat  . A bad rash all over body  . Dizziness and weakness   Immunizations Administered    Name Date Dose VIS Date Route   Pfizer COVID-19 Vaccine 08/09/2019 10:07 AM 0.3 mL 05/26/2018 Intramuscular   Manufacturer: [employer], [employer]   Lot: [id number]   NDC: [address]-6

## 2020-02-07 ENCOUNTER — Encounter: Payer: Self-pay | Admitting: Family Medicine

## 2020-02-11 DIAGNOSIS — R7303 Prediabetes: Secondary | ICD-10-CM | POA: Insufficient documentation

## 2020-02-11 NOTE — Progress Notes (Addendum)
[facility] [address], [address] [place], [place] [address] [phone]  Date:  02/16/2020   Name:  Holly Ramirez   DOB:  11/15/1960   MRN:  [id number]  PCP:  Pearline Cables, MD    Chief Complaint: Weight Loss (trouble losing weight, fasting, dieting, walking daily)   History of Present Illness:  Holly Ramirez is a 59 y.o. very pleasant female patient who presents with the following:  Here today for a follow-up visit  Generally healthy, pre-diabetes  Last seen by myself 04/2018 for CPE- she actually plans to see me for a CPE in the next month or so  She has 2 daughters, married to Holly Ramirez  One of her daughters just turned 9- she was dx with ASD recently.  They hope this evaluation will help with her care going forward   Pt notes she tends to get a lot of body odor and will have tender lymph nodes under her arms at times However she does not have any drainage typical of hydradenitis suppuritiva She has rosacea- she was using doxycycline for a period of time Her rosacea will wax and wane Has not tried Metrogel as of yet   About a month ago she was ill with a fever for 3-4 days, noted cervical nodes She had a rash under her breasts but it went away with home remidies- ?yeast infection She treated it with cetaphil, kept it dry Sometimes her fingers may swell and turn blue- this tends to occur in her left long finger most frequently   She will get right shoulder pain more so when she is looking down- she has worked as a Museum/gallery conservator in the past and had a lot of pain at that time   She notes a lot of difficulty with losing weight, various aches and pains, and rash on face.  ?possible lupus  Due for cologaurd  Flu vaccine-  Mammogram Pap due Labs 04/2018  Wt Readings from Last 3 Encounters:  02/16/20 199 lb (90.3 kg)  04/29/18 194 lb (88 kg)  10/30/17 194 lb 3.2 oz (88.1 kg)     Patient Active Problem List    Diagnosis Date Noted  . Prediabetes 02/11/2020  . Situational stress 01/29/2017  . 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 CHEST PAIN UNSPECIFIED 01/09/2010    History reviewed. No pertinent past medical history.  Past Surgical History:  Procedure Laterality Date  . CESAREAN SECTION     x2  . TONSILLECTOMY      Social History   Tobacco Use  . Smoking status: Never Smoker  . Smokeless tobacco: Never Used  . Tobacco comment: tobacco use- no   Substance Use Topics  . Alcohol use: Yes    Comment: occas wine.   . Drug use: No    Family History  Problem Relation Age of Onset  . Heart attack Mother   . Prostate cancer Father   . Heart attack Brother   . Heart attack Maternal Grandmother   . Heart attack Maternal Grandfather     No Known Allergies  Medication list has been reviewed and updated.  No current outpatient medications on file prior to visit.   No current facility-administered medications on file prior to visit.    Review of Systems:  As per HPI- otherwise negative.   Physical Examination: Vitals:   02/16/20 1101  BP: 122/78  Pulse: 75  Resp: 16  SpO2: 98%   Vitals:   02/16/20 1101  Weight: 199 lb (90.3 kg)  Height: 5\' 6"  (1.676 m)   Body mass index is 32.12 kg/m. Ideal Body Weight: Weight in (lb) to have BMI = 25: 154.6  GEN: no acute distress.    Obese, otherwise looks well Bilateral TM wnl, oropharynx normal.  PEERL,EOMI.   Likely rosacea on face with mild erythema and dryness on face HEENT: Atraumatic, Normocephalic.  Ears and Nose: No external deformity. CV: RRR, No M/G/R. No JVD. No thrill. No extra heart sounds. PULM: CTA B, no wheezes, crackles, rhonchi. No retractions. No resp. distress. No accessory muscle use. ABD: S, NT, ND EXTR: No c/c/e PSYCH: Normally interactive. Conversant.    Assessment and Plan: Prediabetes - Plan: Comprehensive metabolic panel, Hemoglobin A1c  Screening for  hyperlipidemia  Screening for deficiency anemia - Plan: CBC, Comprehensive metabolic panel  Screening for diabetes mellitus  Fatigue, unspecified type - Plan: TSH, VITAMIN D 25 Hydroxy (Vit-D Deficiency, Fractures)  Screening for HIV (human immunodeficiency virus) - Plan: HIV Antibody (routine testing w rflx)  Hair thinning - Plan: TSH, Anti-DNA antibody, double-stranded, ANA, Cardiolipin antibody  Inflammation around joint - Plan: Sedimentation rate, C-reactive protein, Anti-DNA antibody, double-stranded, ANA, Cardiolipin antibody  Chronic right shoulder pain - Plan: Ambulatory referral to Orthopedic Surgery  Rosacea - Plan: metroNIDAZOLE (METROGEL) 1 % gel  Facial rash - Plan: Anti-DNA antibody, double-stranded, ANA, Cardiolipin antibody  Needs flu shot - Plan: Flu Vaccine QUAD 6+ mos PF IM (Fluarix Quad PF)  Pt here today with a few concerns She plans to see me next week for her CPE and pap Will have her try metrogel for her rosacea  Labs pending as above to eval possible autoimmune disorder/ lupus Flu shot given Will plan further follow- up pending labs.   This visit occurred during the SARS-CoV-2 public health emergency.  Safety protocols were in place, including screening questions prior to the visit, additional usage of staff PPE, and extensive cleaning of exam room while observing appropriate contact time as indicated for disinfecting solutions.    Signed , MD Received her labs as below- message to pt   Results for orders placed or performed in visit on 02/16/20  CBC  Result Value Ref Range   WBC 5.0 4.0 - 10.5 K/uL   RBC 4.35 3.87 - 5.11 Mil/uL   Platelets 349.0 150 - 400 K/uL   Hemoglobin 12.8 12.0 - 15.0 g/dL   HCT 02/18/20 36 - 46 %   MCV 87.7 78.0 - 100.0 fl   MCHC 33.7 30.0 - 36.0 g/dL   RDW 56.4 33.2 - 95.1 %  Comprehensive metabolic panel  Result Value Ref Range   Sodium 141 135 - 145 mEq/L   Potassium 4.4 3.5 - 5.1 mEq/L   Chloride 104  96 - 112 mEq/L   CO2 28 19 - 32 mEq/L   Glucose, Bld 91 70 - 99 mg/dL   BUN 12 6 - 23 mg/dL   Creatinine, Ser 88.4 0.40 - 1.20 mg/dL   Total Bilirubin 0.5 0.2 - 1.2 mg/dL   Alkaline Phosphatase 64 39 - 117 U/L   AST 19 0 - 37 U/L   ALT 21 0 - 35 U/L   Total Protein 7.4 6.0 - 8.3 g/dL   Albumin 4.5 3.5 - 5.2 g/dL   GFR 1.66 06.30 mL/min   Calcium 9.6 8.4 - 10.5 mg/dL  Hemoglobin >16.01  Result Value Ref Range   Hgb A1c MFr  Bld 5.8 4.6 - 6.5 %  HIV Antibody (routine testing w rflx)  Result Value Ref Range   HIV 1&2 Ab, 4th Generation NON-REACTIVE NON-REACTI  TSH  Result Value Ref Range   TSH 1.00 0.35 - 4.50 uIU/mL  VITAMIN D 25 Hydroxy (Vit-D Deficiency, Fractures)  Result Value Ref Range   VITD 23.39 (L) 30.00 - 100.00 ng/mL  Sedimentation rate  Result Value Ref Range   Sed Rate 51 (H) 0 - 30 mm/hr  C-reactive protein  Result Value Ref Range   CRP <1.0 0.5 - 20.0 mg/dL  Anti-DNA antibody, double-stranded  Result Value Ref Range   ds DNA Ab <1 IU/mL  ANA  Result Value Ref Range   Anti Nuclear Antibody (ANA) NEGATIVE NEGATIVE    Received more lab info, 11/19- message to pt

## 2020-02-15 ENCOUNTER — Encounter: Payer: Self-pay | Admitting: Family Medicine

## 2020-02-16 ENCOUNTER — Encounter: Payer: Self-pay | Admitting: Family Medicine

## 2020-02-16 ENCOUNTER — Other Ambulatory Visit: Payer: Self-pay

## 2020-02-16 ENCOUNTER — Ambulatory Visit: Payer: Self-pay | Admitting: Family Medicine

## 2020-02-16 VITALS — BP 122/78 | HR 75 | Resp 16 | Ht 66.0 in | Wt 199.0 lb

## 2020-02-16 DIAGNOSIS — R7303 Prediabetes: Secondary | ICD-10-CM

## 2020-02-16 DIAGNOSIS — Z131 Encounter for screening for diabetes mellitus: Secondary | ICD-10-CM | POA: Diagnosis not present

## 2020-02-16 DIAGNOSIS — L659 Nonscarring hair loss, unspecified: Secondary | ICD-10-CM | POA: Diagnosis not present

## 2020-02-16 DIAGNOSIS — Z23 Encounter for immunization: Secondary | ICD-10-CM | POA: Diagnosis not present

## 2020-02-16 DIAGNOSIS — M25511 Pain in right shoulder: Secondary | ICD-10-CM

## 2020-02-16 DIAGNOSIS — R5383 Other fatigue: Secondary | ICD-10-CM | POA: Diagnosis not present

## 2020-02-16 DIAGNOSIS — Z130 Encounter for screening for diseases of the blood and blood-forming organs and certain disorders involving the immune mechanism: Secondary | ICD-10-CM

## 2020-02-16 DIAGNOSIS — G8929 Other chronic pain: Secondary | ICD-10-CM

## 2020-02-16 DIAGNOSIS — M779 Enthesopathy, unspecified: Secondary | ICD-10-CM

## 2020-02-16 DIAGNOSIS — L719 Rosacea, unspecified: Secondary | ICD-10-CM

## 2020-02-16 DIAGNOSIS — Z13220 Encounter for screening for lipoid disorders: Secondary | ICD-10-CM | POA: Diagnosis not present

## 2020-02-16 DIAGNOSIS — Z114 Encounter for screening for human immunodeficiency virus [HIV]: Secondary | ICD-10-CM

## 2020-02-16 DIAGNOSIS — R21 Rash and other nonspecific skin eruption: Secondary | ICD-10-CM

## 2020-02-16 LAB — COMPREHENSIVE METABOLIC PANEL
ALT: 21 U/L (ref 0–35)
AST: 19 U/L (ref 0–37)
Albumin: 4.5 g/dL (ref 3.5–5.2)
Alkaline Phosphatase: 64 U/L (ref 39–117)
BUN: 12 mg/dL (ref 6–23)
CO2: 28 mEq/L (ref 19–32)
Calcium: 9.6 mg/dL (ref 8.4–10.5)
Chloride: 104 mEq/L (ref 96–112)
Creatinine, Ser: 0.57 mg/dL (ref 0.40–1.20)
GFR: 99.84 mL/min (ref >60.00)
Glucose, Bld: 91 mg/dL (ref 70–99)
Potassium: 4.4 mEq/L (ref 3.5–5.1)
Sodium: 141 mEq/L (ref 135–145)
Total Bilirubin: 0.5 mg/dL (ref 0.2–1.2)
Total Protein: 7.4 g/dL (ref 6.0–8.3)

## 2020-02-16 LAB — C-REACTIVE PROTEIN: CRP: <1 mg/dL (ref 0.5–20.0)

## 2020-02-16 LAB — CBC
HCT: 38.2 % (ref 36.0–46.0)
Hemoglobin: 12.8 g/dL (ref 12.0–15.0)
MCHC: 33.7 g/dL (ref 30.0–36.0)
MCV: 87.7 fl (ref 78.0–100.0)
Platelets: 349 10*3/uL (ref 150.0–400.0)
RBC: 4.35 Mil/uL (ref 3.87–5.11)
RDW: 14.2 % (ref 11.5–15.5)
WBC: 5 10*3/uL (ref 4.0–10.5)

## 2020-02-16 LAB — TSH: TSH: 1 u[IU]/mL (ref 0.35–4.50)

## 2020-02-16 LAB — HEMOGLOBIN A1C: Hgb A1c MFr Bld: 5.8 % (ref 4.6–6.5)

## 2020-02-16 LAB — VITAMIN D 25 HYDROXY (VIT D DEFICIENCY, FRACTURES): VITD: 23.39 ng/mL — ABNORMAL LOW (ref 30.00–100.00)

## 2020-02-16 LAB — SEDIMENTATION RATE: Sed Rate: 51 mm/hr — ABNORMAL HIGH (ref 0–30)

## 2020-02-16 MED ORDER — METRONIDAZOLE 1 % EX GEL: Freq: Every day | CUTANEOUS | 0 refills | Status: DC

## 2020-02-16 NOTE — Patient Instructions (Signed)
It was good to see you again today- we will see you soon for your official physical and pap We are doing to do labs today to look for any sign of an autoimmune disorder or lupus.  I am doing your one time HIV screening today as well- of course we expect this to be negative! Try the metronidazole gel once a day for your facial rash  I will be in touch with your labs asap

## 2020-02-18 ENCOUNTER — Encounter: Payer: Self-pay | Admitting: Family Medicine

## 2020-02-21 LAB — CARDIOLIPIN ANTIBODY: Phospholipids: 214 mg/dL (ref 151–264)

## 2020-02-21 LAB — ANA: Anti Nuclear Antibody (ANA): NEGATIVE

## 2020-02-21 LAB — HIV ANTIBODY (ROUTINE TESTING W REFLEX): HIV 1&2 Ab, 4th Generation: NONREACTIVE

## 2020-02-21 LAB — ANTI-DNA ANTIBODY, DOUBLE-STRANDED: ds DNA Ab: <1 IU/mL

## 2020-02-24 NOTE — Patient Instructions (Addendum)
Good to see you again today - I will be in touch with your pap and we will order a cologuard for you as well Please [facility] at [facility] I ordered a repeat sed rate and a rheumatoid factor for you- please schedule a lab visit only in the next 2 months or so to have this done   You can get your covid booster at any time if not done already!    Health Maintenance, Female Adopting a healthy lifestyle and getting preventive care are important in promoting health and wellness. Ask your health care provider about:  The right schedule for you to have regular tests and exams.  Things you can do on your own to prevent diseases and keep yourself healthy. What should I know about diet, weight, and exercise? Eat a healthy diet   Eat a diet that includes plenty of vegetables, fruits, low-fat dairy products, and lean protein.  Do not eat a lot of foods that are high in solid fats, added sugars, or sodium. Maintain a healthy weight Body mass index (BMI) is used to identify weight problems. It estimates body fat based on height and weight. Your health care provider can help determine your BMI and help you achieve or maintain a healthy weight. Get regular exercise Get regular exercise. This is one of the most important things you can do for your health. Most adults should:  Exercise for at least 150 minutes each week. The exercise should increase your heart rate and make you sweat (moderate-intensity exercise).  Do strengthening exercises at least twice a week. This is in addition to the moderate-intensity exercise.  Spend less time sitting. Even light physical activity can be beneficial. Watch cholesterol and blood lipids Have your blood tested for lipids and cholesterol at 59 years of age, then have this test every 5 years. Have your cholesterol levels checked more often if:  Your lipid or cholesterol levels are high.  You are older than 59 years of  age.  You are at high risk for heart disease. What should I know about cancer screening? Depending on your health history and family history, you may need to have cancer screening at various ages. This may include screening for:  Breast cancer.  Cervical cancer.  Colorectal cancer.  Skin cancer.  Lung cancer. What should I know about heart disease, diabetes, and high blood pressure? Blood pressure and heart disease  High blood pressure causes heart disease and increases the risk of stroke. This is more likely to develop in people who have high blood pressure readings, are of African descent, or are overweight.  Have your blood pressure checked: ? Every 3-5 years if you are 72-44 years of age. ? Every year if you are 23 years old or older. Diabetes Have regular diabetes screenings. This checks your fasting blood sugar level. Have the screening done:  Once every three years after age 1 if you are at a normal weight and have a low risk for diabetes.  More often and at a younger age if you are overweight or have a high risk for diabetes. What should I know about preventing infection? Hepatitis B If you have a higher risk for hepatitis B, you should be screened for this virus. Talk with your health care provider to find out if you are at risk for hepatitis B infection. Hepatitis C Testing is recommended for:  Everyone born from [birth year] through 1965.  Anyone with known risk factors  for hepatitis C. Sexually transmitted infections (STIs)  Get screened for STIs, including gonorrhea and chlamydia, if: ? You are sexually active and are younger than 59 years of age. ? You are older than 59 years of age and your health care provider tells you that you are at risk for this type of infection. ? Your sexual activity has changed since you were last screened, and you are at increased risk for chlamydia or gonorrhea. Ask your health care provider if you are at risk.  Ask your health care  provider about whether you are at high risk for HIV. Your health care provider may recommend a prescription medicine to help prevent HIV infection. If you choose to take medicine to prevent HIV, you should first get tested for HIV. You should then be tested every 3 months for as long as you are taking the medicine. Pregnancy  If you are about to stop having your period (premenopausal) and you may become pregnant, seek counseling before you get pregnant.  Take 400 to 800 micrograms (mcg) of folic acid every day if you become pregnant.  Ask for birth control (contraception) if you want to prevent pregnancy. Osteoporosis and menopause Osteoporosis is a disease in which the bones lose minerals and strength with aging. This can result in bone fractures. If you are 46 years old or older, or if you are at risk for osteoporosis and fractures, ask your health care provider if you should:  Be screened for bone loss.  Take a calcium or vitamin D supplement to lower your risk of fractures.  Be given hormone replacement therapy (HRT) to treat symptoms of menopause. Follow these instructions at home: Lifestyle  Do not use any products that contain nicotine or tobacco, such as cigarettes, e-cigarettes, and chewing tobacco. If you need help quitting, ask your health care provider.  Do not use street drugs.  Do not share needles.  Ask your health care provider for help if you need support or information about quitting drugs. Alcohol use  Do not drink alcohol if: ? Your health care provider tells you not to drink. ? You are pregnant, may be pregnant, or are planning to become pregnant.  If you drink alcohol: ? Limit how much you use to 0-1 drink a day. ? Limit intake if you are breastfeeding.  Be aware of how much alcohol is in your drink. In the [place] one drink equals one 12 oz bottle of beer (355 mL), one 5 oz glass of wine (148 mL), or one 1 oz glass of hard liquor (44 mL). General  instructions  Schedule regular health, dental, and eye exams.  Stay current with your vaccines.  Tell your health care provider if: ? You often feel depressed. ? You have ever been abused or do not feel safe at home. Summary  Adopting a healthy lifestyle and getting preventive care are important in promoting health and wellness.  Follow your health care provider's instructions about healthy diet, exercising, and getting tested or screened for diseases.  Follow your health care provider's instructions on monitoring your cholesterol and blood pressure. This information is not intended to replace advice given to you by your health care provider. Make sure you discuss any questions you have with your health care provider. Document Revised: 03/11/2018 Document Reviewed: 03/11/2018 Elsevier Patient Education  2020 [employer].

## 2020-02-24 NOTE — Progress Notes (Signed)
[facility] [address] [place], [place] [address] [phone]  Date:  02/28/2020   Name:  Holly Ramirez   DOB:  18-Sep-1960   MRN:  [id number]  PCP:  Pearline Cables, MD    Chief Complaint: Annual Exam (pap?)   History of Present Illness:  Holly Ramirez is a 59 y.o. very pleasant female patient who presents with the following:  Here today for a CPE and pap History of pre-diabetes  I saw her just recently with a few concerns- we did lab work up which was negative for any apparent autoimmune disorder   cologuard due- ordered for her today  Mammogram- will order for pt  Pap 2018 Adequacy Satisfactory for evaluation endocervical/transformation zone component PRESENT.   Diagnosis NEGATIVE FOR INTRAEPITHELIAL LESIONS OR MALIGNANCY.   HPV NOT DETECTED   Comment: Normal Reference Range - NOT Detected  Material Submitted CervicoVaginal Pap [ThinPrep Imaged]     BP Readings from Last 3 Encounters:  02/28/20 (!) 160/110  02/16/20 122/78  04/29/18 126/90   Sed rate elevated at last labs- we plan to repeat this and also do a RF in 2-3 months .  If persistent high sed rate can consider rheumatology referral.  Pt notes non- specific physical symptoms such as rashes and joint pains  Patient Active Problem List   Diagnosis Date Noted  . Prediabetes 02/11/2020  . Situational stress 01/29/2017  . 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 CHEST PAIN UNSPECIFIED 01/09/2010    History reviewed. No pertinent past medical history.  Past Surgical History:  Procedure Laterality Date  . CESAREAN SECTION     x2  . TONSILLECTOMY      Social History   Tobacco Use  . Smoking status: Never Smoker  . Smokeless tobacco: Never Used  . Tobacco comment: tobacco use- no   Substance Use Topics  . Alcohol use: Yes    Comment: occas wine.   . Drug use: No    Family History  Problem Relation Age of  Onset  . Heart attack Mother   . Prostate cancer Father   . Heart attack Brother   . Heart attack Maternal Grandmother   . Heart attack Maternal Grandfather     No Known Allergies  Medication list has been reviewed and updated.  Current Outpatient Medications on File Prior to Visit  Medication Sig Dispense Refill  . metroNIDAZOLE (METROGEL) 1 % gel Apply topically daily. Use as needed for facial rash 45 g 0   No current facility-administered medications on file prior to visit.    Review of Systems:  As per HPI- otherwise negative.   Physical Examination: Vitals:   02/28/20 0921  BP: (!) 160/110  Pulse: 70  Resp: 16  SpO2: 97%   Vitals:   02/28/20 0921  Weight: 200 lb (90.7 kg)  Height: 5\' 6"  (1.676 m)   Body mass index is 32.28 kg/m. Ideal Body Weight: Weight in (lb) to have BMI = 25: 154.6  GEN: no acute distress.  Obese, otherwise looks well  HEENT: Atraumatic, Normocephalic.  Bilateral TM wnl, oropharynx normal.  PEERL,EOMI.   Ears and Nose: No external deformity. CV: RRR, No M/G/R. No JVD. No thrill. No extra heart sounds. PULM: CTA B, no wheezes, crackles, rhonchi. No retractions. No resp. distress. No accessory muscle use. ABD: S, NT, ND, +BS. No rebound. No HSM. EXTR: No c/c/e PSYCH: Normally interactive. Conversant.  Pap collected.  Vaginal atrophy OW normal    Assessment and Plan: Physical exam  Elevated sed rate - Plan: Sedimentation rate, Rheumatoid Factor  Screening for cervical cancer - Plan: Cytology - PAP  Prediabetes  Screening mammogram for breast cancer - Plan: MM 3D SCREEN BREAST BILATERAL  Screening for colon cancer  CPE today Discussed health maint, encouraged healthy diet and exercise routine Pap, mammo, cologuard ordered  A1c has been stable She will come in for repeat sed rate and RH in the next couple of months  This visit occurred during the SARS-CoV-2 public health emergency.  Safety protocols were in place, including  screening questions prior to the visit, additional usage of staff PPE, and extensive cleaning of exam room while observing appropriate contact time as indicated for disinfecting solutions.    Signed Abbe Amsterdam, MD

## 2020-02-28 ENCOUNTER — Ambulatory Visit (INDEPENDENT_AMBULATORY_CARE_PROVIDER_SITE_OTHER): Payer: Managed Care, Other (non HMO) | Admitting: Family Medicine

## 2020-02-28 ENCOUNTER — Other Ambulatory Visit: Payer: Self-pay

## 2020-02-28 ENCOUNTER — Other Ambulatory Visit (HOSPITAL_COMMUNITY)
Admission: RE | Admit: 2020-02-28 | Discharge: 2020-02-28 | Disposition: A | Discharge status: Home / Self Care | Payer: Managed Care, Other (non HMO) | Source: Ambulatory Visit | Attending: Family Medicine | Admitting: Family Medicine

## 2020-02-28 ENCOUNTER — Encounter: Payer: Self-pay | Admitting: Family Medicine

## 2020-02-28 VITALS — BP 140/90 | HR 70 | Resp 16 | Ht 66.0 in | Wt 200.0 lb

## 2020-02-28 DIAGNOSIS — Z0000 Encounter for general adult medical examination without abnormal findings: Secondary | ICD-10-CM | POA: Diagnosis not present

## 2020-02-28 DIAGNOSIS — R7303 Prediabetes: Secondary | ICD-10-CM

## 2020-02-28 DIAGNOSIS — Z124 Encounter for screening for malignant neoplasm of cervix: Secondary | ICD-10-CM

## 2020-02-28 DIAGNOSIS — Z1231 Encounter for screening mammogram for malignant neoplasm of breast: Secondary | ICD-10-CM

## 2020-02-28 DIAGNOSIS — R700 Elevated erythrocyte sedimentation rate: Secondary | ICD-10-CM | POA: Diagnosis not present

## 2020-02-28 DIAGNOSIS — Z1211 Encounter for screening for malignant neoplasm of colon: Secondary | ICD-10-CM

## 2020-02-28 NOTE — Progress Notes (Signed)
Cologuard ordered for patient.  

## 2020-03-01 ENCOUNTER — Encounter: Payer: Self-pay | Admitting: Family Medicine

## 2020-03-01 LAB — CYTOLOGY - PAP
Comment: NEGATIVE
Diagnosis: NEGATIVE
High risk HPV: NEGATIVE

## 2020-03-02 ENCOUNTER — Other Ambulatory Visit: Payer: Self-pay | Admitting: Family Medicine

## 2020-03-02 DIAGNOSIS — Z1231 Encounter for screening mammogram for malignant neoplasm of breast: Secondary | ICD-10-CM

## 2020-03-02 MED ORDER — DOXYCYCLINE 40 MG PO CPDR: 40.0000 mg | DELAYED_RELEASE_CAPSULE | ORAL | 1 refills | Status: DC

## 2020-03-02 NOTE — Addendum Note (Signed)
Addended by: Abbe Amsterdam C on: 03/02/2020 12:30 PM   Modules accepted: Orders

## 2020-06-27 LAB — COLOGUARD: Cologuard: NEGATIVE

## 2020-07-03 ENCOUNTER — Encounter: Payer: Self-pay | Admitting: Family Medicine

## 2020-07-06 ENCOUNTER — Encounter: Payer: Self-pay | Admitting: Family Medicine

## 2020-10-28 ENCOUNTER — Encounter: Payer: Self-pay | Admitting: Family Medicine

## 2020-10-28 DIAGNOSIS — U071 COVID-19: Secondary | ICD-10-CM

## 2020-10-29 MED ORDER — MOLNUPIRAVIR EUA 200MG CAPSULE: 4.0000 | ORAL_CAPSULE | Freq: Two times a day (BID) | ORAL | 0 refills | Status: AC

## 2020-10-29 NOTE — Addendum Note (Signed)
Addended by: Abbe Amsterdam C on: 10/29/2020 02:39 PM   Modules accepted: Orders

## 2020-11-03 ENCOUNTER — Encounter: Payer: Self-pay | Admitting: Family Medicine

## 2021-04-02 ENCOUNTER — Encounter: Payer: Self-pay | Admitting: Family Medicine

## 2021-04-15 NOTE — Patient Instructions (Addendum)
It was good to see you again today, I will be in touch with your labs are soon as possible  Try adding Singulair for your allergies- let me know if it helps you  We might consider revisiting seeing an allergist if you are not improving at all  Continue to work on exercise   Consider getting the covid booster if not done already

## 2021-04-15 NOTE — Progress Notes (Addendum)
[facility] at [facility] [address], [address] [place], [place] [address] [phone]  Date:  04/18/2021   Name:  Holly Ramirez   DOB:  [birth year]   MRN:  [id number]  PCP:  Pearline Cables, MD    Chief Complaint: Annual Exam (Concerns/ questions: 1. Allergy sxs have worsened taking Allegra. 2. Sciatica 3. Eye issues from the Stye that she had./Received flu shot already)   History of Present Illness:  Holly Ramirez is a 61 y.o. very pleasant female patient who presents with the following:  Patient seen today for physical exam History of prediabetes, HSV-2 Most recent visit with myself November 2021 She had sort of forgotten to be seen as all has been ok   Mammogram-2019 Can also offer bone density-ordered for her today Pap smear up-to-date Cologuard up-to-date Most recent labs November 2021-at that time her sedimentation rate was mildly elevated, can recheck today  She has 2 daughters-her younger daughter has suffered with some mental health challenges  Shingrix is done Can suggest flu shot- done  COVID-19 booster  Pt has noted more allergy sx over the last several months-she is bothered by typical nasal/sinus symptoms and itchy eyes She has been using allegra and also nasal steroid spray She has done allergy testing and shots in the past - she has a lot of allergies to animals, etc  She still feels like her eyes are sensitive and itchy despite liberal use of saline She is seeing her eye doctor soon for an eye exam  Patient Active Problem List   Diagnosis Date Noted   Prediabetes 02/11/2020   Situational stress 01/29/2017   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  Past Surgical History:  Procedure Laterality Date   CESAREAN SECTION     x2   TONSILLECTOMY      Social History   Tobacco Use   Smoking status: Never    Smokeless tobacco: Never   Tobacco comments:    tobacco use- no   Substance Use Topics   Alcohol use: Yes    Comment: occas wine.    Drug use: No    Family History  Problem Relation Age of Onset   Heart attack Mother    Prostate cancer Father    Heart attack Brother    Heart attack Maternal Grandmother    Heart attack Maternal Grandfather     No Known Allergies  Medication list has been reviewed and updated.  Current Outpatient Medications on File Prior to Visit  Medication Sig Dispense Refill   fexofenadine (ALLEGRA) 180 MG tablet Take 180 mg by mouth daily.     No current facility-administered medications on file prior to visit.    Review of Systems:  As per HPI- otherwise negative.   Physical Examination: Vitals:   04/18/21 0934  BP: (!) 144/80  Pulse: 75  Resp: 18  Temp: 98.1 F (36.7 C)  SpO2: 98%   Vitals:   04/18/21 0934  Weight: 185 lb 6.4 oz (84.1 kg)  Height: 5\' 6"  (1.676 m)   Body mass index is 29.92 kg/m. Ideal Body Weight: Weight in (lb) to have BMI = 25: 154.6  GEN: no acute distress.  Overweight, looks well HEENT: Atraumatic, Normocephalic.  Bilateral TM wnl, oropharynx normal.  PEERL,EOMI.   Ears and Nose: No external deformity. CV: RRR, No M/G/R. No JVD. No  thrill. No extra heart sounds. PULM: CTA B, no wheezes, crackles, rhonchi. No retractions. No resp. distress. No accessory muscle use. ABD: S, NT, ND, +BS. No rebound. No HSM. EXTR: No c/c/e PSYCH: Normally interactive. Conversant.   Assessment and Plan: Physical exam  Prediabetes - Plan: Comprehensive metabolic panel, Hemoglobin A1c  Elevated sed rate - Plan: Sedimentation rate  Screening for hyperlipidemia - Plan: Lipid panel  Screening for deficiency anemia - Plan: CBC  Fatigue, unspecified type - Plan: TSH, VITAMIN D 25 Hydroxy (Vit-D Deficiency, Fractures)  Vitamin D deficiency - Plan: VITAMIN D 25 Hydroxy (Vit-D Deficiency, Fractures)  Encounter for screening  mammogram for malignant neoplasm of breast - Plan: MM 3D SCREEN BREAST BILATERAL  Estrogen deficiency - Plan: DG Bone Density  Non-seasonal allergic rhinitis due to pollen - Plan: montelukast (SINGULAIR) 10 MG tablet    Patient seen today for physical exam.  Encouraged healthy diet and exercise routine Will plan further follow- up pending labs.  We discussed her allergies.  Patient states extensive environmental allergies.  She has seen an allergist and done immunotherapy in the past, unfortunately did not seem to help a lot.  We decided to try adding Singulair to her regimen, discussed black box warning.  She will let me know how this works for her    Signed Abbe Amsterdam, MD  Received labs as below, message to patient Results for orders placed or performed in visit on 04/18/21  CBC  Result Value Ref Range   WBC 5.9 4.0 - 10.5 K/uL   RBC 4.45 3.87 - 5.11 Mil/uL   Platelets 339.0 150.0 - 400.0 K/uL   Hemoglobin 13.0 12.0 - 15.0 g/dL   HCT 88.9 16.9 - 45.0 %   MCV 89.2 78.0 - 100.0 fl   MCHC 32.9 30.0 - 36.0 g/dL   RDW 38.8 82.8 - 00.3 %  Comprehensive metabolic panel  Result Value Ref Range   Sodium 140 135 - 145 mEq/L   Potassium 4.6 3.5 - 5.1 mEq/L   Chloride 101 96 - 112 mEq/L   CO2 29 19 - 32 mEq/L   Glucose, Bld 96 70 - 99 mg/dL   BUN 15 6 - 23 mg/dL   Creatinine, Ser 4.91 0.40 - 1.20 mg/dL   Total Bilirubin 0.6 0.2 - 1.2 mg/dL   Alkaline Phosphatase 67 39 - 117 U/L   AST 15 0 - 37 U/L   ALT 14 0 - 35 U/L   Total Protein 7.5 6.0 - 8.3 g/dL   Albumin 4.6 3.5 - 5.2 g/dL   GFR 79.15 >05.69 mL/min   Calcium 9.5 8.4 - 10.5 mg/dL  Hemoglobin V9Y  Result Value Ref Range   Hgb A1c MFr Bld 5.8 4.6 - 6.5 %  Lipid panel  Result Value Ref Range   Cholesterol 164 0 - 200 mg/dL   Triglycerides 80.1 0.0 - 149.0 mg/dL   HDL 65.53 >74.82 mg/dL   VLDL 70.7 0.0 - 86.7 mg/dL   LDL Cholesterol 89 0 - 99 mg/dL   Total CHOL/HDL Ratio 3    NonHDL 106.25   Sedimentation rate   Result Value Ref Range   Sed Rate 19 0 - 30 mm/hr  TSH  Result Value Ref Range   TSH 1.70 0.35 - 5.50 uIU/mL  VITAMIN D 25 Hydroxy (Vit-D Deficiency, Fractures)  Result Value Ref Range   VITD 24.41 (L) 30.00 - 100.00 ng/mL

## 2021-04-18 ENCOUNTER — Encounter: Payer: Self-pay | Admitting: Family Medicine

## 2021-04-18 ENCOUNTER — Ambulatory Visit (HOSPITAL_BASED_OUTPATIENT_CLINIC_OR_DEPARTMENT_OTHER)
Admission: RE | Admit: 2021-04-18 | Discharge: 2021-04-18 | Disposition: A | Discharge status: Home / Self Care | Payer: Managed Care, Other (non HMO) | Source: Ambulatory Visit | Attending: Family Medicine | Admitting: Family Medicine

## 2021-04-18 ENCOUNTER — Ambulatory Visit (INDEPENDENT_AMBULATORY_CARE_PROVIDER_SITE_OTHER): Payer: Managed Care, Other (non HMO) | Admitting: Family Medicine

## 2021-04-18 ENCOUNTER — Other Ambulatory Visit: Payer: Self-pay

## 2021-04-18 VITALS — BP 144/80 | HR 75 | Temp 98.1°F | Resp 18 | Ht 66.0 in | Wt 185.4 lb

## 2021-04-18 DIAGNOSIS — R700 Elevated erythrocyte sedimentation rate: Secondary | ICD-10-CM

## 2021-04-18 DIAGNOSIS — Z1231 Encounter for screening mammogram for malignant neoplasm of breast: Secondary | ICD-10-CM

## 2021-04-18 DIAGNOSIS — Z13220 Encounter for screening for lipoid disorders: Secondary | ICD-10-CM | POA: Diagnosis not present

## 2021-04-18 DIAGNOSIS — E2839 Other primary ovarian failure: Secondary | ICD-10-CM

## 2021-04-18 DIAGNOSIS — R5383 Other fatigue: Secondary | ICD-10-CM | POA: Diagnosis not present

## 2021-04-18 DIAGNOSIS — R7303 Prediabetes: Secondary | ICD-10-CM | POA: Diagnosis not present

## 2021-04-18 DIAGNOSIS — Z130 Encounter for screening for diseases of the blood and blood-forming organs and certain disorders involving the immune mechanism: Secondary | ICD-10-CM

## 2021-04-18 DIAGNOSIS — E559 Vitamin D deficiency, unspecified: Secondary | ICD-10-CM

## 2021-04-18 DIAGNOSIS — J301 Allergic rhinitis due to pollen: Secondary | ICD-10-CM

## 2021-04-18 DIAGNOSIS — Z0000 Encounter for general adult medical examination without abnormal findings: Secondary | ICD-10-CM | POA: Diagnosis not present

## 2021-04-18 LAB — SEDIMENTATION RATE: Sed Rate: 19 mm/hr (ref 0–30)

## 2021-04-18 LAB — LIPID PANEL
Cholesterol: 164 mg/dL (ref 0–200)
HDL: 57.8 mg/dL (ref >39.00)
LDL Cholesterol: 89 mg/dL (ref 0–99)
NonHDL: 106.25
Total CHOL/HDL Ratio: 3
Triglycerides: 85 mg/dL (ref 0.0–149.0)
VLDL: 17 mg/dL (ref 0.0–40.0)

## 2021-04-18 LAB — COMPREHENSIVE METABOLIC PANEL
ALT: 14 U/L (ref 0–35)
AST: 15 U/L (ref 0–37)
Albumin: 4.6 g/dL (ref 3.5–5.2)
Alkaline Phosphatase: 67 U/L (ref 39–117)
BUN: 15 mg/dL (ref 6–23)
CO2: 29 mEq/L (ref 19–32)
Calcium: 9.5 mg/dL (ref 8.4–10.5)
Chloride: 101 mEq/L (ref 96–112)
Creatinine, Ser: 0.62 mg/dL (ref 0.40–1.20)
GFR: 97.04 mL/min (ref >60.00)
Glucose, Bld: 96 mg/dL (ref 70–99)
Potassium: 4.6 mEq/L (ref 3.5–5.1)
Sodium: 140 mEq/L (ref 135–145)
Total Bilirubin: 0.6 mg/dL (ref 0.2–1.2)
Total Protein: 7.5 g/dL (ref 6.0–8.3)

## 2021-04-18 LAB — CBC
HCT: 39.7 % (ref 36.0–46.0)
Hemoglobin: 13 g/dL (ref 12.0–15.0)
MCHC: 32.9 g/dL (ref 30.0–36.0)
MCV: 89.2 fl (ref 78.0–100.0)
Platelets: 339 10*3/uL (ref 150.0–400.0)
RBC: 4.45 Mil/uL (ref 3.87–5.11)
RDW: 14.9 % (ref 11.5–15.5)
WBC: 5.9 10*3/uL (ref 4.0–10.5)

## 2021-04-18 LAB — HEMOGLOBIN A1C: Hgb A1c MFr Bld: 5.8 % (ref 4.6–6.5)

## 2021-04-18 LAB — TSH: TSH: 1.7 u[IU]/mL (ref 0.35–5.50)

## 2021-04-18 LAB — VITAMIN D 25 HYDROXY (VIT D DEFICIENCY, FRACTURES): VITD: 24.41 ng/mL — ABNORMAL LOW (ref 30.00–100.00)

## 2021-04-18 MED ORDER — MONTELUKAST SODIUM 10 MG PO TABS: 10.0000 mg | ORAL_TABLET | Freq: Every day | ORAL | 3 refills | Status: DC

## 2021-04-19 ENCOUNTER — Other Ambulatory Visit (HOSPITAL_BASED_OUTPATIENT_CLINIC_OR_DEPARTMENT_OTHER): Payer: Managed Care, Other (non HMO)

## 2021-04-19 ENCOUNTER — Ambulatory Visit (HOSPITAL_BASED_OUTPATIENT_CLINIC_OR_DEPARTMENT_OTHER): Payer: Managed Care, Other (non HMO)

## 2021-04-26 ENCOUNTER — Ambulatory Visit (HOSPITAL_BASED_OUTPATIENT_CLINIC_OR_DEPARTMENT_OTHER)
Admission: RE | Admit: 2021-04-26 | Discharge: 2021-04-26 | Disposition: A | Discharge status: Home / Self Care | Payer: Managed Care, Other (non HMO) | Source: Ambulatory Visit | Attending: Family Medicine | Admitting: Family Medicine

## 2021-04-26 ENCOUNTER — Encounter: Payer: Self-pay | Admitting: Family Medicine

## 2021-04-26 ENCOUNTER — Encounter (HOSPITAL_BASED_OUTPATIENT_CLINIC_OR_DEPARTMENT_OTHER): Payer: Self-pay

## 2021-04-26 ENCOUNTER — Other Ambulatory Visit: Payer: Self-pay

## 2021-04-26 DIAGNOSIS — E2839 Other primary ovarian failure: Secondary | ICD-10-CM | POA: Insufficient documentation

## 2021-04-26 DIAGNOSIS — Z1231 Encounter for screening mammogram for malignant neoplasm of breast: Secondary | ICD-10-CM | POA: Diagnosis not present

## 2021-07-14 ENCOUNTER — Other Ambulatory Visit: Payer: Self-pay | Admitting: Family Medicine

## 2021-07-14 DIAGNOSIS — J301 Allergic rhinitis due to pollen: Secondary | ICD-10-CM

## 2021-08-15 ENCOUNTER — Encounter: Payer: Self-pay | Admitting: Family Medicine

## 2021-09-02 NOTE — Patient Instructions (Incomplete)
Good to see you today- we will get some x-rays today and I will refer you to see Dr Katrinka Blazing with sports medicine for your upper back pain

## 2021-09-02 NOTE — Progress Notes (Unsigned)
[facility] [address] [place], [place] [address] [phone]  Date:  09/03/2021   Name:  Holly Ramirez   DOB:  11/29/1960   MRN:  [id number]  PCP:  Holly Mclean, MD    Chief Complaint: No chief complaint on file.   History of Present Illness:  Holly Ramirez is a 61 y.o. 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  Past Surgical History:  Procedure Laterality Date   CESAREAN SECTION     x2   TONSILLECTOMY      Social History   Tobacco Use   Smoking status: Never   Smokeless tobacco: Never   Tobacco comments:    tobacco use- no   Substance Use Topics   Alcohol use: Yes    Comment: occas wine.    Drug use: No    Family History  Problem Relation Age of Onset   Heart attack Mother    Prostate cancer Father    Heart attack Brother    Heart attack Maternal Grandmother    Heart attack Maternal Grandfather     No Known Allergies  Medication list has been reviewed and updated.  Current Outpatient Medications on File Prior to Visit  Medication Sig Dispense Refill   fexofenadine (ALLEGRA) 180 MG tablet Take 180 mg by mouth daily.     montelukast (SINGULAIR) 10 MG tablet TAKE 1 TABLET BY MOUTH EVERYDAY AT BEDTIME 90 tablet 1   No current facility-administered medications on file prior to visit.    Review of Systems:  As per HPI- otherwise negative.   Physical  Examination: There were no vitals filed for this visit. There were no vitals filed for this visit. There is no height or weight on file to calculate BMI. Ideal Body Weight:    GEN: no acute distress. HEENT: Atraumatic, Normocephalic.  Ears and Nose: No external deformity. CV: RRR, No M/G/R. No JVD. No thrill. No extra heart sounds. PULM: CTA B, no wheezes, crackles, rhonchi. No retractions. No resp. distress. No accessory muscle use. ABD: S, NT, ND, +BS. No rebound. No HSM. EXTR: No c/c/e PSYCH: Normally interactive. Conversant.    Assessment and Plan: ***  Signed Lamar Blinks, MD

## 2021-09-03 ENCOUNTER — Ambulatory Visit (HOSPITAL_BASED_OUTPATIENT_CLINIC_OR_DEPARTMENT_OTHER)
Admission: RE | Admit: 2021-09-03 | Discharge: 2021-09-03 | Disposition: A | Discharge status: Home / Self Care | Payer: Managed Care, Other (non HMO) | Source: Ambulatory Visit | Attending: Family Medicine | Admitting: Family Medicine

## 2021-09-03 ENCOUNTER — Ambulatory Visit: Payer: Managed Care, Other (non HMO) | Admitting: Family Medicine

## 2021-09-03 VITALS — BP 122/72 | HR 81 | Temp 97.9°F | Resp 18 | Ht 66.0 in | Wt 177.0 lb

## 2021-09-03 DIAGNOSIS — M25511 Pain in right shoulder: Secondary | ICD-10-CM

## 2021-09-03 DIAGNOSIS — G8929 Other chronic pain: Secondary | ICD-10-CM | POA: Diagnosis not present

## 2021-09-04 ENCOUNTER — Encounter: Payer: Self-pay | Admitting: Family Medicine

## 2021-09-20 ENCOUNTER — Ambulatory Visit: Payer: Managed Care, Other (non HMO) | Admitting: Family Medicine

## 2021-11-20 ENCOUNTER — Encounter: Payer: Self-pay | Admitting: Family Medicine

## 2021-11-20 DIAGNOSIS — W57XXXA Bitten or stung by nonvenomous insect and other nonvenomous arthropods, initial encounter: Secondary | ICD-10-CM

## 2021-11-23 ENCOUNTER — Other Ambulatory Visit (INDEPENDENT_AMBULATORY_CARE_PROVIDER_SITE_OTHER): Payer: Managed Care, Other (non HMO)

## 2021-11-23 DIAGNOSIS — W57XXXA Bitten or stung by nonvenomous insect and other nonvenomous arthropods, initial encounter: Secondary | ICD-10-CM

## 2021-11-23 DIAGNOSIS — T148XXA Other injury of unspecified body region, initial encounter: Secondary | ICD-10-CM

## 2021-11-25 ENCOUNTER — Encounter: Payer: Self-pay | Admitting: Family Medicine

## 2021-11-25 LAB — B. BURGDORFI ANTIBODIES: B burgdorferi Ab IgG+IgM: <0.9 index

## 2022-04-16 NOTE — Progress Notes (Signed)
[place] at [facility] [address] [place], [place] [address] [phone]  Date:  04/22/2022   Name:  Holly Ramirez   DOB:  [birth year]   MRN:  [id number]  PCP:  Darreld Mclean, MD    Chief Complaint: No chief complaint on file.   History of Present Illness:  Holly Ramirez is a 62 y.o. very pleasant female patient who presents with the following:  Pt seen today for a CPE Last visit with myself was in June  History of prediabetes, vit D def   Flu shot- pt declines today due to strong side effects  Covid booster recommended Cologuard, mammo UTD- due soon  Never a smoker  Labs one year ago   Brittanee is concerned about "skin health" today  Pt notes she may have developed "psoriasis" on her scalp, forehead and chest since we last saw each other She does have history of eczema - has not yet seen derm about her current symptoms She notes that her skin seems to be dry, itchy and sensitive  She is trying to treat at home with some home remedies and having some success  She does have a derm at [facility] - she will get in touch with them   She is having sciatica down her right leg which is not new Heat and stretching/ exercises may help She has noted this for about one year-   She has also had what sounds like raynaues phenomenon for 3 years or so - she notes she will get some blue or red discoloration of her fingers and esp the toes on her left foot which is worse when she gets cold This will resolve when she warms up Not painful   She did see an allergist several years ago - she did shots, etc but they did not really help her She is using allegra again now  She notes a chronic history of allergies over the years     Patient Active Problem List   Diagnosis Date Noted   Prediabetes 02/11/2020   Situational stress 01/29/2017   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  Past Surgical History:  Procedure Laterality Date   CESAREAN SECTION     x2   TONSILLECTOMY      Social History   Tobacco Use   Smoking status: Never   Smokeless tobacco: Never   Tobacco comments:    tobacco use- no   Substance Use Topics   Alcohol use: Yes    Comment: occas wine.    Drug use: No    Family History  Problem Relation Age of Onset   Heart attack Mother    Prostate cancer Father    Heart attack Brother    Heart attack Maternal Grandmother    Heart attack Maternal Grandfather     No Known Allergies  Medication list has been reviewed and updated.  Current Outpatient Medications on File Prior to Visit  Medication Sig Dispense Refill   fexofenadine (ALLEGRA) 180 MG tablet Take 180 mg by mouth daily.     No current facility-administered medications on file prior to visit.    Review of Systems:  As per HPI- otherwise negative.   Physical Examination: Vitals:   04/22/22 0853  BP: 133/85  Pulse: 73  SpO2: 99%   Vitals:   04/22/22 0853  Weight: 179 lb 9.6 oz (81.5 kg)  Height: 5\' 6"  (1.676 m)   Body mass index is 28.99 kg/m. Ideal Body Weight: Weight in (lb) to have BMI = 25: 154.6  GEN: no acute distress. Mild overweight, looks well  HEENT: Atraumatic, Normocephalic.  Dry scaly skin on her scalp and forehead Ears and Nose: No external deformity. CV: RRR, No M/G/R. No JVD. No thrill. No extra heart sounds. PULM: CTA B, no wheezes, crackles, rhonchi. No retractions. No resp. distress. No accessory muscle use. ABD: S, NT, ND, +BS. No rebound. No HSM. EXTR: No c/c/e PSYCH: Normally interactive. Conversant.  The tip of her left 2nd toe is blue- pt states this has come and gone for the last 3 years or so, it is not painful She also notes she will get similar sx of the left 4th toe and that it may blister at times    Assessment and Plan: Physical exam  Prediabetes - Plan: Comprehensive  metabolic panel, Hemoglobin A1c  Screening for hyperlipidemia - Plan: Lipid panel  Vitamin D deficiency - Plan: VITAMIN D 25 Hydroxy (Vit-D Deficiency, Fractures)  Screening for deficiency anemia - Plan: CBC  Screening for thyroid disorder - Plan: TSH  Elevated sed rate - Plan: Antinuclear Antib (ANA), Sedimentation rate, C-reactive protein, Rheumatoid Factor  Screening mammogram for breast cancer - Plan: MM 3D SCREEN BREAST BILATERAL  Sciatica, right side - Plan: cyclobenzaprine (FLEXERIL) 10 MG tablet  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 She has a few other concerns today Asked her to see her dermatologist regarding her skin changes- I am not sure if this is psoriasis.  She will do so- if appt is delayed she will let me know "Blue toe" and raynaud's- referral to vascular for consultation due to blue toe changes Various unusual sx, skin changes, vascular change, allergies- will obtain a basic autoimmune WO as above    Signed , MD  Received labs- message to pt   Results for orders placed or performed in visit on 04/22/22  CBC  Result Value Ref Range   WBC 4.8 4.0 - 10.5 K/uL   RBC 4.39 3.87 - 5.11 Mil/uL   Platelets 351.0 150.0 - 400.0 K/uL   Hemoglobin 13.3 12.0 - 15.0 g/dL   HCT 04/24/22 60.4 - 54.0 %   MCV 90.7 78.0 - 100.0 fl   MCHC 33.4 30.0 - 36.0 g/dL   RDW 98.1 19.1 - 47.8 %  Comprehensive metabolic panel  Result Value Ref Range   Sodium 139 135 - 145 mEq/L   Potassium 4.3 3.5 - 5.1 mEq/L   Chloride 102 96 - 112 mEq/L   CO2 27 19 - 32 mEq/L   Glucose, Bld 100 (H) 70 - 99 mg/dL   BUN 16 6 - 23 mg/dL   Creatinine, Ser 29.5 0.40 - 1.20 mg/dL   Total Bilirubin 0.6 0.2 - 1.2 mg/dL   Alkaline Phosphatase 55 39 - 117 U/L   AST 16 0 - 37 U/L   ALT 13  0 - 35 U/L   Total Protein 7.6 6.0 - 8.3 g/dL   Albumin 4.8 3.5 - 5.2 g/dL   GFR 6.21 308.65 mL/min   Calcium 9.8 8.4 - 10.5 mg/dL  Hemoglobin >78.46  Result Value Ref Range   Hgb A1c MFr Bld 5.8  4.6 - 6.5 %  Lipid panel  Result Value Ref Range   Cholesterol 195 0 - 200 mg/dL   Triglycerides 83.0 0.0 - 149.0 mg/dL   HDL 71.90 >39.00 mg/dL   VLDL 16.6 0.0 - 40.0 mg/dL   LDL Cholesterol 106 (H) 0 - 99 mg/dL   Total CHOL/HDL Ratio 3    NonHDL 122.80   TSH  Result Value Ref Range   TSH 1.78 0.35 - 5.50 uIU/mL  VITAMIN D 25 Hydroxy (Vit-D Deficiency, Fractures)  Result Value Ref Range   VITD 43.71 30.00 - 100.00 ng/mL  Sedimentation rate  Result Value Ref Range   Sed Rate 33 (H) 0 - 30 mm/hr  C-reactive protein  Result Value Ref Range   CRP <1.0 0.5 - 20.0 mg/dL

## 2022-04-16 NOTE — Patient Instructions (Addendum)
Good to see you today- I will be in touch with your labs asap Please call your dermatologist about your skin- if they are not able to see you fairly soon please do alert me  If your dermatology appt will be delayed we can start you on something for your skin Please show your derm your "blue toe" as well   Try to avoid exposure to cold air and hot water, keep your skin moisturized with a gentle moisturizer  Keep your hands and feet warm- this will reduce symptoms of Raynaud's   Try the flexeril at bedtime for your back pain- if you would like to do some x-rays at any time we can do so

## 2022-04-22 ENCOUNTER — Ambulatory Visit (INDEPENDENT_AMBULATORY_CARE_PROVIDER_SITE_OTHER): Payer: Managed Care, Other (non HMO) | Admitting: Family Medicine

## 2022-04-22 ENCOUNTER — Encounter: Payer: Self-pay | Admitting: Family Medicine

## 2022-04-22 VITALS — BP 133/85 | HR 73 | Ht 66.0 in | Wt 179.6 lb

## 2022-04-22 DIAGNOSIS — M5431 Sciatica, right side: Secondary | ICD-10-CM

## 2022-04-22 DIAGNOSIS — R700 Elevated erythrocyte sedimentation rate: Secondary | ICD-10-CM

## 2022-04-22 DIAGNOSIS — R7303 Prediabetes: Secondary | ICD-10-CM | POA: Diagnosis not present

## 2022-04-22 DIAGNOSIS — L219 Seborrheic dermatitis, unspecified: Secondary | ICD-10-CM

## 2022-04-22 DIAGNOSIS — Z130 Encounter for screening for diseases of the blood and blood-forming organs and certain disorders involving the immune mechanism: Secondary | ICD-10-CM

## 2022-04-22 DIAGNOSIS — Z1329 Encounter for screening for other suspected endocrine disorder: Secondary | ICD-10-CM

## 2022-04-22 DIAGNOSIS — E559 Vitamin D deficiency, unspecified: Secondary | ICD-10-CM

## 2022-04-22 DIAGNOSIS — Z1231 Encounter for screening mammogram for malignant neoplasm of breast: Secondary | ICD-10-CM

## 2022-04-22 DIAGNOSIS — Z13220 Encounter for screening for lipoid disorders: Secondary | ICD-10-CM | POA: Diagnosis not present

## 2022-04-22 DIAGNOSIS — Z0000 Encounter for general adult medical examination without abnormal findings: Secondary | ICD-10-CM

## 2022-04-22 DIAGNOSIS — I75022 Atheroembolism of left lower extremity: Secondary | ICD-10-CM

## 2022-04-22 LAB — COMPREHENSIVE METABOLIC PANEL
ALT: 13 U/L (ref 0–35)
AST: 16 U/L (ref 0–37)
Albumin: 4.8 g/dL (ref 3.5–5.2)
Alkaline Phosphatase: 55 U/L (ref 39–117)
BUN: 16 mg/dL (ref 6–23)
CO2: 27 mEq/L (ref 19–32)
Calcium: 9.8 mg/dL (ref 8.4–10.5)
Chloride: 102 mEq/L (ref 96–112)
Creatinine, Ser: 0.52 mg/dL (ref 0.40–1.20)
GFR: 100.52 mL/min (ref >60.00)
Glucose, Bld: 100 mg/dL — ABNORMAL HIGH (ref 70–99)
Potassium: 4.3 mEq/L (ref 3.5–5.1)
Sodium: 139 mEq/L (ref 135–145)
Total Bilirubin: 0.6 mg/dL (ref 0.2–1.2)
Total Protein: 7.6 g/dL (ref 6.0–8.3)

## 2022-04-22 LAB — LIPID PANEL
Cholesterol: 195 mg/dL (ref 0–200)
HDL: 71.9 mg/dL (ref >39.00)
LDL Cholesterol: 106 mg/dL — ABNORMAL HIGH (ref 0–99)
NonHDL: 122.8
Total CHOL/HDL Ratio: 3
Triglycerides: 83 mg/dL (ref 0.0–149.0)
VLDL: 16.6 mg/dL (ref 0.0–40.0)

## 2022-04-22 LAB — CBC
HCT: 39.8 % (ref 36.0–46.0)
Hemoglobin: 13.3 g/dL (ref 12.0–15.0)
MCHC: 33.4 g/dL (ref 30.0–36.0)
MCV: 90.7 fl (ref 78.0–100.0)
Platelets: 351 10*3/uL (ref 150.0–400.0)
RBC: 4.39 Mil/uL (ref 3.87–5.11)
RDW: 14.3 % (ref 11.5–15.5)
WBC: 4.8 10*3/uL (ref 4.0–10.5)

## 2022-04-22 LAB — C-REACTIVE PROTEIN: CRP: <1 mg/dL (ref 0.5–20.0)

## 2022-04-22 LAB — SEDIMENTATION RATE: Sed Rate: 33 mm/hr — ABNORMAL HIGH (ref 0–30)

## 2022-04-22 LAB — TSH: TSH: 1.78 u[IU]/mL (ref 0.35–5.50)

## 2022-04-22 LAB — VITAMIN D 25 HYDROXY (VIT D DEFICIENCY, FRACTURES): VITD: 43.71 ng/mL (ref 30.00–100.00)

## 2022-04-22 LAB — HEMOGLOBIN A1C: Hgb A1c MFr Bld: 5.8 % (ref 4.6–6.5)

## 2022-04-22 MED ORDER — CYCLOBENZAPRINE HCL 10 MG PO TABS: 10.0000 mg | ORAL_TABLET | Freq: Every day | ORAL | 1 refills | Status: DC

## 2022-04-25 ENCOUNTER — Encounter: Payer: Self-pay | Admitting: Family Medicine

## 2022-04-25 LAB — ANA: Anti Nuclear Antibody (ANA): NEGATIVE

## 2022-04-25 LAB — RHEUMATOID FACTOR: Rheumatoid fact SerPl-aCnc: <14 IU/mL (ref <14)

## 2022-04-26 ENCOUNTER — Telehealth (HOSPITAL_BASED_OUTPATIENT_CLINIC_OR_DEPARTMENT_OTHER): Payer: Self-pay

## 2022-05-09 ENCOUNTER — Other Ambulatory Visit: Payer: Self-pay | Admitting: *Deleted

## 2022-05-09 DIAGNOSIS — I75029 Atheroembolism of unspecified lower extremity: Secondary | ICD-10-CM

## 2022-05-20 ENCOUNTER — Ambulatory Visit: Payer: Managed Care, Other (non HMO) | Admitting: Vascular Surgery

## 2022-05-20 ENCOUNTER — Ambulatory Visit (HOSPITAL_COMMUNITY)
Admission: RE | Admit: 2022-05-20 | Discharge: 2022-05-20 | Disposition: A | Discharge status: Home / Self Care | Payer: Managed Care, Other (non HMO) | Source: Ambulatory Visit | Attending: Vascular Surgery | Admitting: Vascular Surgery

## 2022-05-20 ENCOUNTER — Encounter: Payer: Self-pay | Admitting: Vascular Surgery

## 2022-05-20 VITALS — BP 144/89 | HR 71 | Temp 98.1°F | Resp 14 | Ht 65.0 in | Wt 180.0 lb

## 2022-05-20 DIAGNOSIS — I75029 Atheroembolism of unspecified lower extremity: Secondary | ICD-10-CM | POA: Diagnosis not present

## 2022-05-20 DIAGNOSIS — I7300 Raynaud's syndrome without gangrene: Secondary | ICD-10-CM | POA: Diagnosis not present

## 2022-05-20 NOTE — Progress Notes (Unsigned)
Office Note     CC: Raynaud's phenomenon Requesting Provider:  Copland, Gay Filler, MD  HPI: Holly Ramirez is a 62 y.o. (Jul 27, 1960) female presenting at the request of .Copland, Gay Filler, MD for evaluation of Raynaud's phenomenon.  On exam, Holly Ramirez was doing well.  A native of [place], she has lived in [place] for quite some time and works as a Chief Executive Officer.  Holly Ramirez has appreciated color changes in bilateral toes and, in some cases, fingers.  There is blanching, followed by a bluish discoloration, and finally erythema.  The erythema is associated with pain.  This usually occurs with cold exposure, and states that it is most prevalent this time of the year.  She denies wound healing concerns, she denies claudication, ischemic rest pain.  Holly Ramirez is a non-smoker, drinks 1 to 2 cups of coffee per day.    History reviewed. No pertinent past medical history.  Past Surgical History:  Procedure Laterality Date   CESAREAN SECTION     x2   TONSILLECTOMY      Social History   Socioeconomic History   Marital status: Married    Spouse name: Not on file   Number of children: Not on file   Years of education: Not on file   Highest education level: Not on file  Occupational History   Not on file  Tobacco Use   Smoking status: Never   Smokeless tobacco: Never   Tobacco comments:    tobacco use- no   Substance and Sexual Activity   Alcohol use: Yes    Comment: occas wine.    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    Social Determinants of Health   Financial Resource Strain: Not on file  Food Insecurity: Not on file  Transportation Needs: Not on file  Physical Activity: Not on file  Stress: Not on file  Social Connections: Not on file  Intimate Partner Violence: Not on file   Family History  Problem Relation Age of Onset    Heart attack Mother    Prostate cancer Father    Heart attack Brother    Heart attack Maternal Grandmother    Heart attack Maternal Grandfather     Current Outpatient Medications  Medication Sig Dispense Refill   fexofenadine (ALLEGRA) 180 MG tablet Take 180 mg by mouth daily.     cyclobenzaprine (FLEXERIL) 10 MG tablet Take 1 tablet (10 mg total) by mouth at bedtime. Use as needed for back pain 30 tablet 1   No current facility-administered medications for this visit.    No Known Allergies   REVIEW OF SYSTEMS:  [X]$  denotes positive finding, [ ]$  denotes negative finding Cardiac  Comments:  Chest pain or chest pressure:    Shortness of breath upon exertion:    Short of breath when lying flat:    Irregular heart rhythm:        Vascular    Pain in calf, thigh, or hip brought on by ambulation:    Pain in feet at night that wakes you up from your sleep:     Blood clot in your veins:    Leg swelling:         Pulmonary    Oxygen at home:    Productive cough:     Wheezing:  Neurologic    Sudden weakness in arms or legs:     Sudden numbness in arms or legs:     Sudden onset of difficulty speaking or slurred speech:    Temporary loss of vision in one eye:     Problems with dizziness:         Gastrointestinal    Blood in stool:     Vomited blood:         Genitourinary    Burning when urinating:     Blood in urine:        Psychiatric    Major depression:         Hematologic    Bleeding problems:    Problems with blood clotting too easily:        Skin    Rashes or ulcers:        Constitutional    Fever or chills:      PHYSICAL EXAMINATION:  Vitals:   05/20/22 1503  BP: (!) 144/89  Pulse: 71  Resp: 14  Temp: 98.1 F (36.7 C)  TempSrc: Temporal  SpO2: 97%  Weight: 180 lb (81.6 kg)  Height: 5' 5"$  (1.651 m)    General:  WDWN in NAD; vital signs documented above Gait: Not observed HENT: WNL, normocephalic Pulmonary: normal non-labored  breathing , without wheezing Cardiac: regular HR Abdomen: soft, NT, no masses Skin: without rashes Vascular Exam/Pulses:  Right Left  Radial 2+ (normal) 2+ (normal)  Ulnar 2+ (normal) 2+ (normal)  Femoral    Popliteal    DP 2+ (normal) 2+ (normal)  PT 2+ (normal) 2+ (normal)   Extremities: without ischemic changes, without Gangrene , without cellulitis; without open wounds;  Some discoloration on today's visit in the left fourth toe.  She states this waxes and wanes and is one of the main toes affected. Musculoskeletal: no muscle wasting or atrophy  Neurologic: A&O X 3;  No focal weakness or paresthesias are detected Psychiatric:  The pt has Normal affect.   Non-Invasive Vascular Imaging:   ABI Findings:  +---------+------------------+-----+---------+--------+  Right   Rt Pressure (mmHg)IndexWaveform Comment   +---------+------------------+-----+---------+--------+  Brachial 131                                       +---------+------------------+-----+---------+--------+  PTA     164               1.18 triphasic          +---------+------------------+-----+---------+--------+  DP      177               1.27 triphasic          +---------+------------------+-----+---------+--------+  Great Toe115               0.83                    +---------+------------------+-----+---------+--------+   +---------+------------------+-----+--------+-------+  Left    Lt Pressure (mmHg)IndexWaveformComment  +---------+------------------+-----+--------+-------+  Brachial 139                                     +---------+------------------+-----+--------+-------+  PTA     170               1.22 biphasic         +---------+------------------+-----+--------+-------+  DP  184               1.32 biphasic         +---------+------------------+-----+--------+-------+  Great Toe104               0.75                   +---------+------------------+-----+--------+-------+   +-------+-----------+-----------+------------+------------+  ABI/TBIToday's ABIToday's TBIPrevious ABIPrevious TBI  +-------+-----------+-----------+------------+------------+  Right 1.27       0.83                                 +-------+-----------+-----------+------------+------------+  Left  1.32       0.75                                 +-------+-----------+-----------+------------+------------+     TOES Findings:  +----------+---------------+----------------------+-------+  Right ToesPressure (mmHg)Waveform              Comment  +----------+---------------+----------------------+-------+  1st Digit                Dampened                       +----------+---------------+----------------------+-------+  2nd Digit                Significantly dampened         +----------+---------------+----------------------+-------+  3rd Digit                Significantly dampened         +----------+---------------+----------------------+-------+  4th Digit                Dampened                       +----------+---------------+----------------------+-------+  5th Digit                Dampened                       +----------+---------------+----------------------+-------+      +---------+---------------+----------------------+----------------+  Left ToesPressure (mmHg)Waveform              Comment           +---------+---------------+----------------------+----------------+  1st Digit               Dampened                                +---------+---------------+----------------------+----------------+  2nd Digit               Absent                Cyanosis present  +---------+---------------+----------------------+----------------+  3rd Digit               Significantly dampened                   +---------+---------------+----------------------+----------------+  4th Digit               Dampened                                +---------+---------------+----------------------+----------------+  5th Digit  Dampened                                +---------+---------------+----------------------+----------------+           Summary:  Right: Resting right ankle-brachial index is within normal range. The  right toe-brachial index is normal.   Left: Resting left ankle-brachial index is within normal range. The left  toe-brachial index is normal.     ASSESSMENT/PLAN: TENLI KADO is a 62 y.o. female presenting with classic symptoms of primary Raynaud's syndrome.  ABIs were reviewed, and were normal in bilateral lower extremities.  There were dampened toe pressures, however this is nondiagnostic as the patient was not warmed.  Physical exam and timeline of symptoms are inconsistent with blue toe syndrome as this is from either cardioembolic or atheroembolic event which leads to necrosis.  Regarding symptoms, Holly Ramirez has classic discoloration changes and pain typical of Raynaud's syndrome. She has no wounds, and she denies signs and symptoms which usually accompany secondary Raynaud's.  For thoroughness, and ESR could be checked as this should be normal with primary Raynaud's, and elevated with secondary Raynaud's.  Secondary Raynaud's would benefit from rheumatology follow-up/autoimmune workup.    There are several treatments for Raynaud's. First-line therapy is reducing exposure to cold, and eliminating ergot's such as caffeine which can cause vasospasm. From a medication standpoint, nifedipine, which is a calcium channel blocker, which can increase vasodilation in the fingers and toes. This can be prescribed in an extended release tablet.  Should nifedipine not work, amlodipine has also proven to have some efficacy. Phosphodiesterase 5 inhibitors are  second line therapy - Sildenafil, tadalafil, or vardenafil. From a topical standpoint, nitric oxide cream can be used as well.   Holly Ramirez states that her symptoms are livable, and she does not want to take any medications.  She plans to discuss possible nitric oxide cream with Dr. Edilia Bo.  I asked her to call my office immediately should she have 2 wounds that occurred that are nonhealing.  I am happy to see her back in the office as needed.  Broadus John, MD [facility] [phone]

## 2022-05-21 ENCOUNTER — Encounter (HOSPITAL_COMMUNITY): Payer: Self-pay | Admitting: Vascular Surgery

## 2022-05-21 LAB — VAS US ABI WITH/WO TBI
Left ABI: 1.32
Right ABI: 1.27

## 2022-06-20 ENCOUNTER — Encounter (HOSPITAL_BASED_OUTPATIENT_CLINIC_OR_DEPARTMENT_OTHER): Payer: Self-pay

## 2022-06-20 ENCOUNTER — Ambulatory Visit (HOSPITAL_BASED_OUTPATIENT_CLINIC_OR_DEPARTMENT_OTHER)
Admission: RE | Admit: 2022-06-20 | Discharge: 2022-06-20 | Disposition: A | Discharge status: Home / Self Care | Payer: Managed Care, Other (non HMO) | Source: Ambulatory Visit | Attending: Family Medicine | Admitting: Family Medicine

## 2022-06-20 DIAGNOSIS — Z1231 Encounter for screening mammogram for malignant neoplasm of breast: Secondary | ICD-10-CM

## 2022-12-23 ENCOUNTER — Encounter: Payer: Self-pay | Admitting: Family Medicine

## 2023-01-22 IMAGING — DX DG RIBS 2V*R*
2 series · 2 of 2 positions shown · non-contrast
Comparison: None Available.

CLINICAL DATA: Pain right chest

EXAM:
RIGHT RIBS - 2 VIEW

[rib ap]
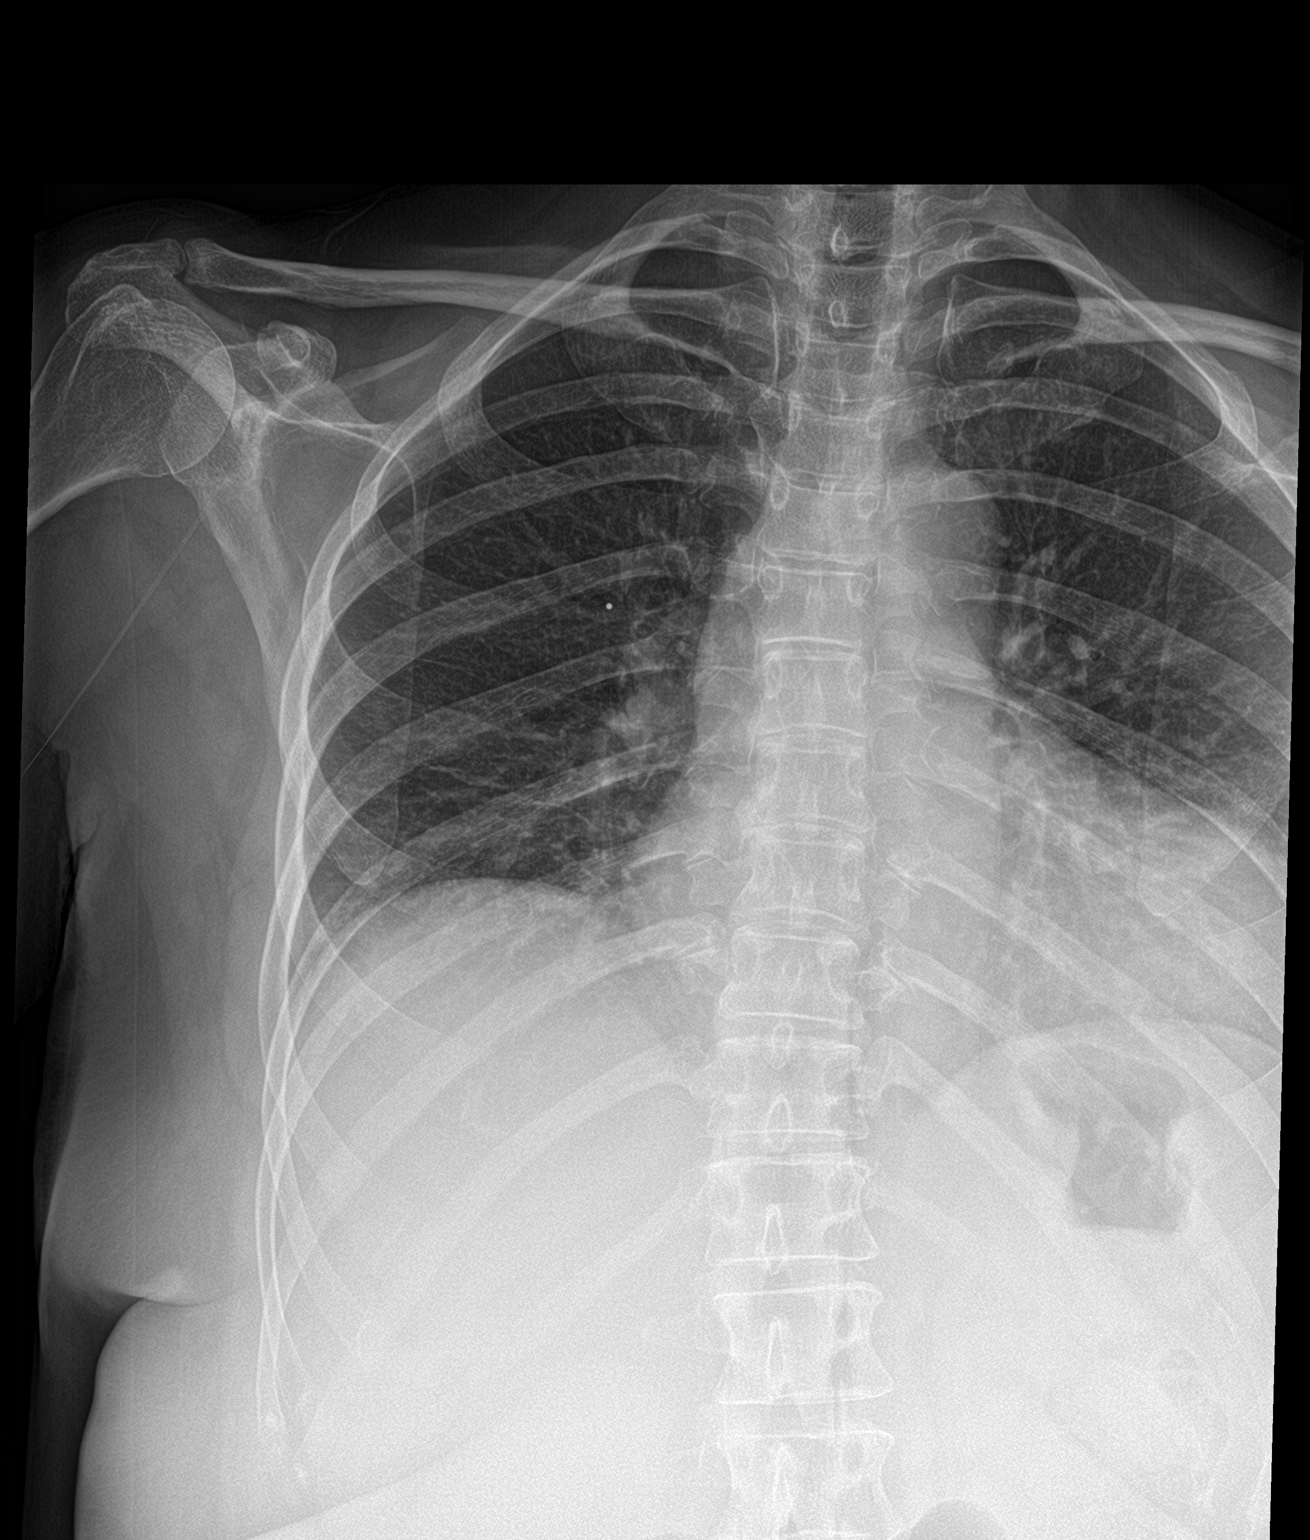

[rib ap obl]
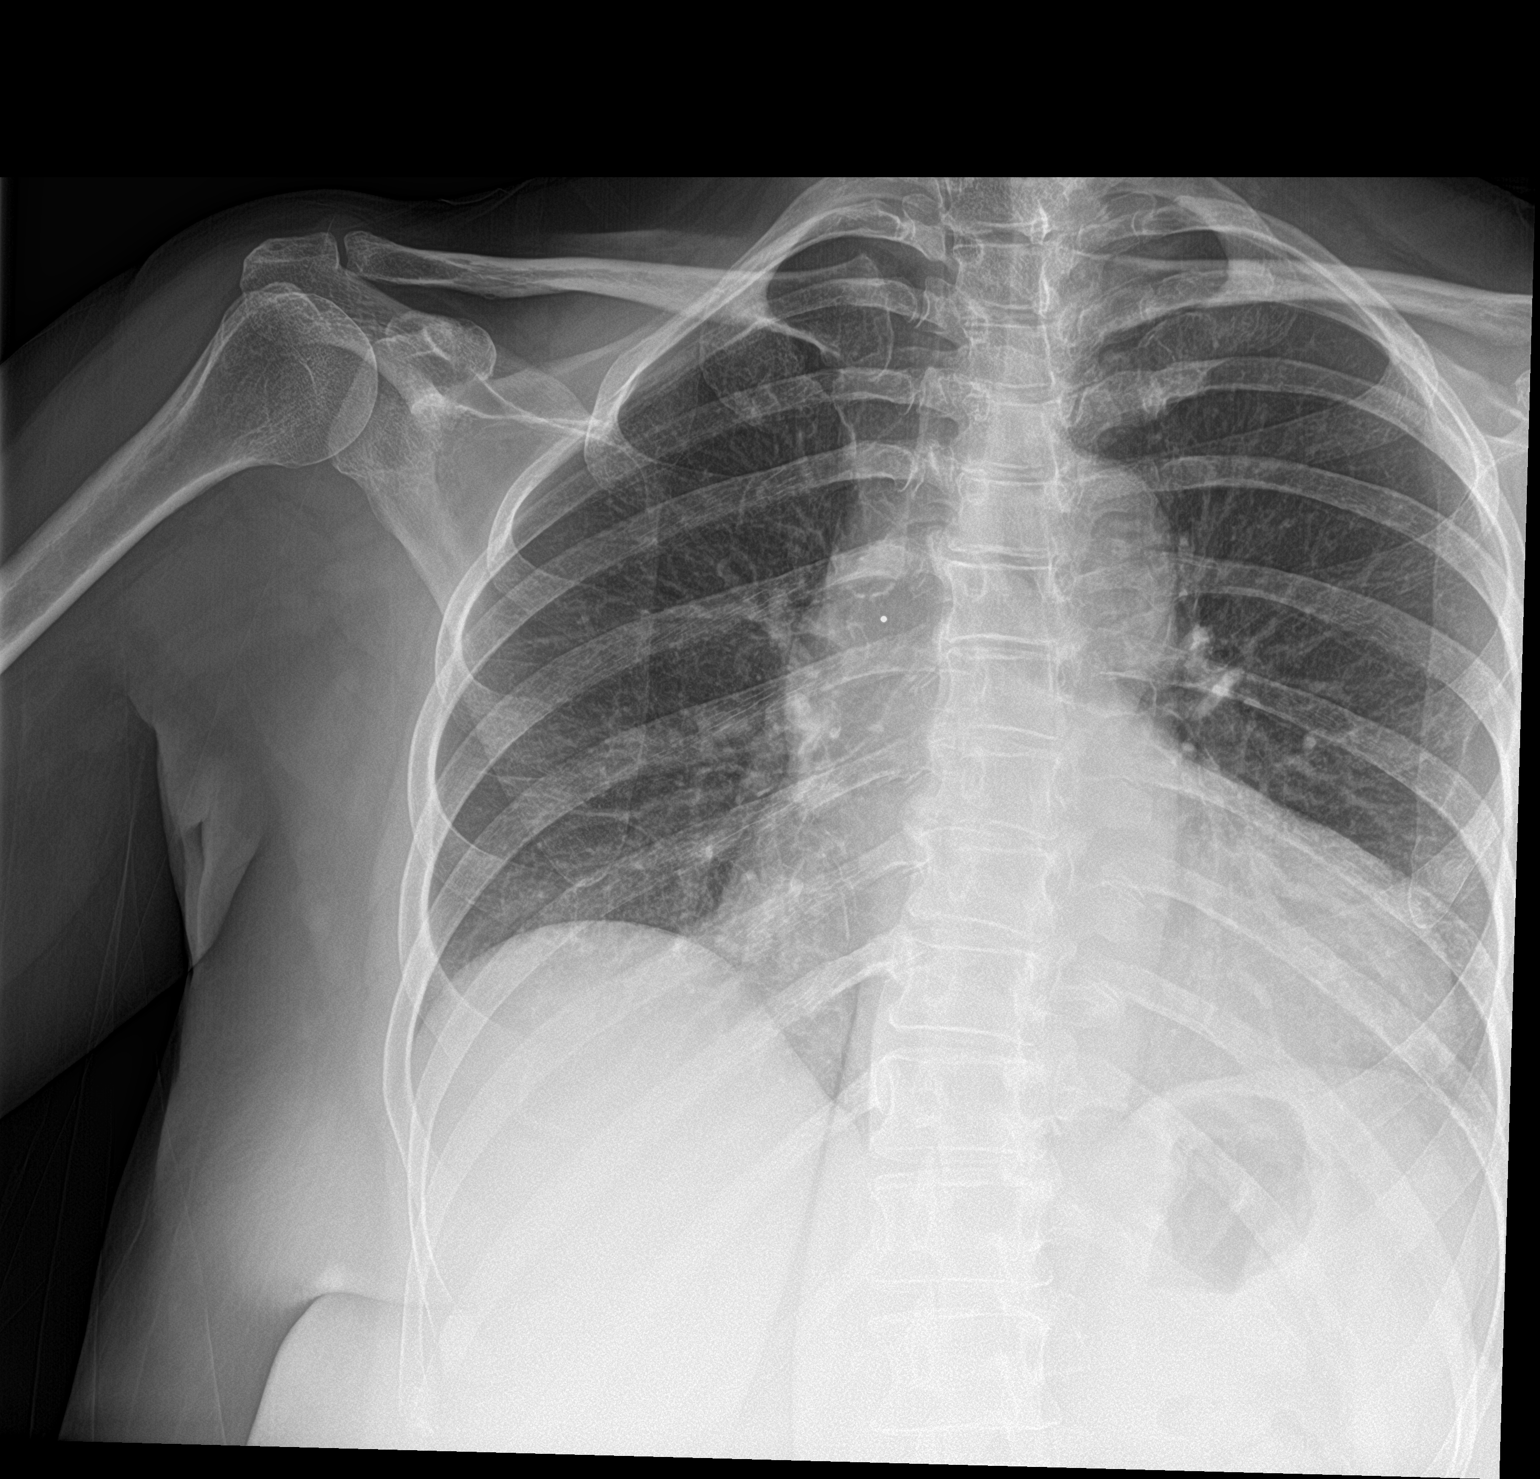

[2 of 2 positions shown; findings below may reference images not displayed]

FINDINGS: No fracture or other bone lesions are seen involving the ribs.
IMPRESSION: No radiographic abnormalities are seen in the right ribs.

## 2023-01-30 ENCOUNTER — Encounter: Payer: Self-pay | Admitting: Family Medicine

## 2023-01-31 NOTE — Progress Notes (Unsigned)
[facility] at [facility] [address] [place], [place] [address] [phone]  Date:  02/03/2023   Name:  Holly Ramirez   DOB:  Apr 21, 1960   MRN:  [id number]  PCP:  Pearline Cables, MD    Chief Complaint: No chief complaint on file.   History of Present Illness:  Holly Ramirez is a 62 y.o. 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  Past Surgical History:  Procedure Laterality Date   CESAREAN SECTION     x2   TONSILLECTOMY      Social History   Tobacco Use   Smoking status: Never   Smokeless tobacco: Never   Tobacco comments:    tobacco use- no   Substance Use Topics   Alcohol use: Yes    Comment: occas wine.    Drug use: No    Family History  Problem Relation Age of Onset   Heart attack Mother    Prostate cancer Father    Heart attack Brother    Heart attack Maternal Grandmother    Heart attack Maternal Grandfather     No Known Allergies  Medication list has been reviewed and updated.  Current Outpatient Medications on File Prior to Visit  Medication Sig Dispense Refill   cyclobenzaprine (FLEXERIL) 10 MG tablet Take 1 tablet (10 mg total) by mouth at bedtime. Use as needed for back pain 30 tablet 1   fexofenadine (ALLEGRA) 180 MG tablet Take 180 mg by mouth daily.     No current facility-administered medications on file prior to visit.    Review of Systems:  As per HPI- otherwise negative.   Physical Examination: There were no vitals filed for this visit. There were no vitals filed for this  visit. There is no height or weight on file to calculate BMI. Ideal Body Weight:    ***  Assessment and Plan: ***  Signed Abbe Amsterdam, MD

## 2023-02-03 ENCOUNTER — Telehealth (INDEPENDENT_AMBULATORY_CARE_PROVIDER_SITE_OTHER): Payer: Managed Care, Other (non HMO) | Admitting: Family Medicine

## 2023-02-03 ENCOUNTER — Encounter: Payer: Self-pay | Admitting: Family Medicine

## 2023-02-03 DIAGNOSIS — E663 Overweight: Secondary | ICD-10-CM

## 2023-02-03 MED ORDER — WEGOVY 0.25 MG/0.5ML ~~LOC~~ SOAJ: 0.2500 mg | SUBCUTANEOUS | 1 refills | Status: AC

## 2023-04-21 NOTE — Patient Instructions (Incomplete)
Good to see you today- I will be in touch with your labs asap  Due for colon cancer screening- Cologuard or colonoscopy- in March,  I can order a kit or do a GI referral per your preference  Tetanus is due this summer

## 2023-04-21 NOTE — Progress Notes (Unsigned)
[facility] at [facility] [address], [address] [place], [place] [address] [phone]  Date:  04/24/2023   Name:  Holly Ramirez   DOB:  01/27/1961   MRN:  [id number]  PCP:  Pearline Cables, MD    Chief Complaint: No chief complaint on file.   History of Present Illness:  Holly Ramirez is a 63 y.o. 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  Past Surgical History:  Procedure Laterality Date   CESAREAN SECTION     x2   TONSILLECTOMY      Social History   Tobacco Use   Smoking status: Never   Smokeless tobacco: Never   Tobacco comments:    tobacco use- no   Substance Use Topics   Alcohol use: Yes    Comment: occas wine.    Drug use: No    Family History  Problem Relation Age of Onset   Heart attack Mother    Prostate cancer Father    Heart attack Brother    Heart attack Maternal Grandmother    Heart attack Maternal Grandfather     No Known Allergies  Medication list has been reviewed and updated.  Current Outpatient Medications on File Prior to Visit  Medication Sig Dispense Refill   cyclobenzaprine (FLEXERIL) 10 MG tablet Take 1 tablet (10 mg total) by mouth at bedtime. Use as needed for back pain 30 tablet 1   fexofenadine (ALLEGRA) 180 MG tablet Take 180 mg by mouth daily.     Semaglutide-Weight Management (WEGOVY) 0.25 MG/0.5ML SOAJ Inject 0.25 mg into the skin once a week. Increase to 0.5 in 4 weeks 2  mL 1   No current facility-administered medications on file prior to visit.    Review of Systems:  As per HPI- otherwise negative.   Physical Examination: There were no vitals filed for this visit. There were no vitals filed for this visit. There is no height or weight on file to calculate BMI. Ideal Body Weight:    GEN: no acute distress. HEENT: Atraumatic, Normocephalic.  Ears and Nose: No external deformity. CV: RRR, No M/G/R. No JVD. No thrill. No extra heart sounds. PULM: CTA B, no wheezes, crackles, rhonchi. No retractions. No resp. distress. No accessory muscle use. ABD: S, NT, ND, +BS. No rebound. No HSM. EXTR: No c/c/e PSYCH: Normally interactive. Conversant.    Assessment and Plan: *** Physical exam- encouraged healthy diet and exercise routine  Signed Abbe Amsterdam, MD

## 2023-04-24 ENCOUNTER — Ambulatory Visit (INDEPENDENT_AMBULATORY_CARE_PROVIDER_SITE_OTHER): Payer: Managed Care, Other (non HMO) | Admitting: Family Medicine

## 2023-04-24 ENCOUNTER — Encounter: Payer: Self-pay | Admitting: Family Medicine

## 2023-04-24 VITALS — BP 130/80 | HR 78 | Temp 98.0°F | Resp 18 | Ht 66.0 in | Wt 174.0 lb

## 2023-04-24 DIAGNOSIS — Z130 Encounter for screening for diseases of the blood and blood-forming organs and certain disorders involving the immune mechanism: Secondary | ICD-10-CM

## 2023-04-24 DIAGNOSIS — E559 Vitamin D deficiency, unspecified: Secondary | ICD-10-CM

## 2023-04-24 DIAGNOSIS — Z1329 Encounter for screening for other suspected endocrine disorder: Secondary | ICD-10-CM | POA: Diagnosis not present

## 2023-04-24 DIAGNOSIS — R7303 Prediabetes: Secondary | ICD-10-CM | POA: Diagnosis not present

## 2023-04-24 DIAGNOSIS — Z13220 Encounter for screening for lipoid disorders: Secondary | ICD-10-CM | POA: Diagnosis not present

## 2023-04-24 DIAGNOSIS — Z0000 Encounter for general adult medical examination without abnormal findings: Secondary | ICD-10-CM | POA: Diagnosis not present

## 2023-04-24 LAB — COMPREHENSIVE METABOLIC PANEL
ALT: 12 U/L (ref 0–35)
AST: 16 U/L (ref 0–37)
Albumin: 4.8 g/dL (ref 3.5–5.2)
Alkaline Phosphatase: 52 U/L (ref 39–117)
BUN: 11 mg/dL (ref 6–23)
CO2: 27 meq/L (ref 19–32)
Calcium: 9.8 mg/dL (ref 8.4–10.5)
Chloride: 102 meq/L (ref 96–112)
Creatinine, Ser: 0.6 mg/dL (ref 0.40–1.20)
GFR: 96.43 mL/min (ref >60.00)
Glucose, Bld: 85 mg/dL (ref 70–99)
Potassium: 4.2 meq/L (ref 3.5–5.1)
Sodium: 140 meq/L (ref 135–145)
Total Bilirubin: 0.7 mg/dL (ref 0.2–1.2)
Total Protein: 7.5 g/dL (ref 6.0–8.3)

## 2023-04-24 LAB — VITAMIN D 25 HYDROXY (VIT D DEFICIENCY, FRACTURES): VITD: 36.38 ng/mL (ref 30.00–100.00)

## 2023-04-24 LAB — LIPID PANEL
Cholesterol: 164 mg/dL (ref 0–200)
HDL: 61.5 mg/dL (ref >39.00)
LDL Cholesterol: 91 mg/dL (ref 0–99)
NonHDL: 102.58
Total CHOL/HDL Ratio: 3
Triglycerides: 58 mg/dL (ref 0.0–149.0)
VLDL: 11.6 mg/dL (ref 0.0–40.0)

## 2023-04-24 LAB — CBC
HCT: 41.6 % (ref 36.0–46.0)
Hemoglobin: 13.7 g/dL (ref 12.0–15.0)
MCHC: 32.9 g/dL (ref 30.0–36.0)
MCV: 90.4 fL (ref 78.0–100.0)
Platelets: 355 10*3/uL (ref 150.0–400.0)
RBC: 4.6 Mil/uL (ref 3.87–5.11)
RDW: 14 % (ref 11.5–15.5)
WBC: 4.5 10*3/uL (ref 4.0–10.5)

## 2023-04-24 LAB — TSH: TSH: 1.96 u[IU]/mL (ref 0.35–5.50)

## 2023-04-24 LAB — HEMOGLOBIN A1C: Hgb A1c MFr Bld: 5.8 % (ref 4.6–6.5)

## 2023-08-28 ENCOUNTER — Encounter: Payer: Self-pay | Admitting: Family Medicine

## 2023-08-28 DIAGNOSIS — R195 Other fecal abnormalities: Secondary | ICD-10-CM
